# Patient Record
Sex: Female | Born: 1970 | Race: White | Hispanic: No | Marital: Married | Smoking: Former smoker
Health system: Southern US, Community
[De-identification: ages and names within clinical notes are randomized; demographics above are authoritative.]

## PROBLEM LIST (undated history)

## (undated) DIAGNOSIS — G43909 Migraine, unspecified, not intractable, without status migrainosus: Secondary | ICD-10-CM

## (undated) DIAGNOSIS — D869 Sarcoidosis, unspecified: Secondary | ICD-10-CM

## (undated) DIAGNOSIS — G510 Bell's palsy: Secondary | ICD-10-CM

## (undated) NOTE — ED Provider Notes (Signed)
 Formatting of this note is different from the original. Images from the original note were not included. eMERGENCY dEPARTMENT eNCOUnter    CHIEF COMPLAINT   Chief Complaint  Patient presents with  ? Wound Dehiscence    Surgery this AM to removed ganglion cyst and bone spur to left great toe. Dr. Jerrye at [place] with [facility]. Wound has opened up. Seen at urgent care and sent for surgeon evaluation.    HPI   Kristin Bowman is a 36 y.o. female who presents to the ED for evaluation of a surgical wound.  Per patient, she underwent surgery this morning on the left great toe for removal of a ganglionic cyst and bone spur.  Patient states that a layered closure was performed.  States that the skin was closed with glue.  States that shortly after leaving the surgical center this morning he the surgical incision opened back up.  Noted associated pain and bleeding from the site.  She went to urgent care and was told to come to the emergency department for further evaluation. No reported trauma.   Additional historical information was provided by husband, at bedside.  PAST MEDICAL HISTORY   Past Medical History:  Diagnosis Date  ? Bell's palsy   ? Celiac disease   ? Pulmonary sarcoidosis [facility]    SURGICAL HISTORY   Past Surgical History:  Procedure Laterality Date  ? HYSTERECTOMY     CURRENT MEDICATIONS   No current facility-administered medications for this encounter.   No current outpatient medications on file.   ALLERGIES   Allergies  Allergen Reactions  ? Wheat Other (See Comments)    celiac  ? Morphine Nausea And Vomiting   FAMILY HISTORY   History reviewed. No pertinent family history.  SOCIAL HISTORY   Social History   Socioeconomic History  ? Marital status: Married    Spouse name: None  ? Number of children: None  ? Years of education: None  ? Highest education level: None  Occupational History  ? None  Tobacco Use  ? Smoking status: None  ? Smokeless  tobacco: Never  Substance and Sexual Activity  ? Alcohol use: Yes    Alcohol/week: 3.0 standard drinks of alcohol    Types: 3 Glasses of wine per week  ? Drug use: Never  ? Sexual activity: None  Other Topics Concern  ? None  Social History Narrative  ? None   Social Determinants of Health   Financial Resource Strain: Not on file  Food Insecurity: Not on file  Transportation Needs: Not on file  Social Connections: Not on file  Housing Stability: Not on file   REVIEW OF SYSTEMS   Constitutional:  Denies fever Eyes:  Denies redness, discharge HENT:  Denies sore throat, ear pain  Respiratory:  Denies shortness of breath Cardiovascular:  Denies chest pain GI:  Denies abdominal pain Musculoskeletal:  Positive for left great toe pain. Skin:  Positive for surgical wound dehiscence. Neurologic:  Denies focal weakness, sensory changes See HPI for further details.  PHYSICAL EXAM   VITAL SIGNS: BP (!) 157/81   Pulse 84   Temp 97.7 F (36.5 C)   Resp 16   Ht 5' 9 (1.753 m)   Wt 185 lb (83.9 kg)   SpO2 98%   BMI 27.32 kg/m  Constitutional:  Well developed and well nourished. No acute distress. Well appearing. Eyes:  PERRL. EOMI. Conjunctiva normal. No discharge HENT: Normocephalic. Atraumatic. External ears clear bilaterally. Nares clear bilaterally. Oropharynx is  clear. Mucous membranes are moist Neck: Normal inspection. Normal range of motion. Neck is supple  Respiratory:  Clear to ausculation bilaterally. No wheezing Cardiovascular:  Normal heart rate. Normal rhythm. No murmurs, rubs, or gallops. Abdomen: Bowel sounds normal. Abdomen is soft, non-tender, and non-distended. No rebound or guarding. Extremities:  3 cm surgical incision to the dorsum of the left foot over the 1st MTP joint. Wound has opened with small amount of the clotted blood in the wound. 2+ left DP pulse. Back: Normal inspection Skin:  Warm, dry, no erythema, no rash.  Neurologic:  Awake and alert x4.  Sensation intact. Strength 5/5 bilateral upper and lower extremities Psych: Mood is normal.  RADIOLOGY   None  PROCEDURES    Laceration Repair Procedure Note  Sterile prep and drape in the standard technique after thorough high-pressure irrigation with normal saline.  Suture placement: A 3 cm opened surgical incision to the left foot.  Area was anesthetized with 1% lidocaine.  4 sutures were placed using 4.0 of Ethilon with good approximation of the wound edges.  Patient tolerated the procedures without any problems. No foreign bodies were seen.  Complications: None  ED COURSE & MEDICAL DECISION MAKING    This is a 7 yo female with reported PMHx of Bells Palsy, Celiac diseases, and pulmonary sarcoidosis who presents to the ED with left foot surgical wound dehiscence.  Patient hypertensive at 157/81.  Vital signs otherwise stable.  Exam, as above, with 3 cm surgical wound dehiscence.  No evidence for secondary infection.  Ddx:  Wound dehiscence  Medications administered:  Lidocaine and Keflex  Consultations:  Orthopedic surgery, Dr. Jerrye  ED Course:  Initially discussed case with Dr. Rosan, orthopedic surgery who recommended Steri-Strips and outpatient follow-up.  I was able to get in touch with Dr. Jerrye who did the surgery earlier today.  He actually requested that we close the wound with sutures.  I was able to close the patient's wound without difficulty.  She received dose of Keflex.  She will follow-up with Dr. Jerrye in the office tomorrow.  I believe she is safe for discharge.  Strict return precautions given.  She is agreeable.  FINAL IMPRESSION   1. Postoperative wound dehiscence, initial encounter      laceration repair  Portions of this note may be dictated using Dragon Naturally Speaking voice recognition software.  Variances in spelling and vocabulary are possible and unintentional.  Not all errors are caught/corrected.  Please notify the dino if any  discrepancies are noted or if the meaning of any statement is not clear.    Arlean CHRISTELLA Otto, MD 08/10/21 2034  Electronically signed by Arlean CHRISTELLA Otto, MD at 08/10/2021  8:34 PM EDT

---

## 1998-03-02 ENCOUNTER — Inpatient Hospital Stay (HOSPITAL_COMMUNITY): Admission: AD | Admit: 1998-03-02 | Discharge: 1998-03-02 | Payer: Self-pay | Admitting: Obstetrics and Gynecology

## 1998-04-13 ENCOUNTER — Inpatient Hospital Stay (HOSPITAL_COMMUNITY): Admission: AD | Admit: 1998-04-13 | Discharge: 1998-04-13 | Payer: Self-pay | Admitting: *Deleted

## 1998-04-14 ENCOUNTER — Inpatient Hospital Stay (HOSPITAL_COMMUNITY): Admission: AD | Admit: 1998-04-14 | Discharge: 1998-04-14 | Payer: Self-pay | Admitting: Obstetrics and Gynecology

## 1998-04-19 ENCOUNTER — Inpatient Hospital Stay (HOSPITAL_COMMUNITY): Admission: AD | Admit: 1998-04-19 | Discharge: 1998-04-22 | Payer: Self-pay | Admitting: Obstetrics and Gynecology

## 1998-04-22 ENCOUNTER — Encounter (HOSPITAL_COMMUNITY): Admission: RE | Admit: 1998-04-22 | Discharge: 1998-07-21 | Payer: Self-pay | Admitting: *Deleted

## 1998-05-25 ENCOUNTER — Other Ambulatory Visit: Admission: RE | Admit: 1998-05-25 | Discharge: 1998-05-25 | Payer: Self-pay | Admitting: Obstetrics and Gynecology

## 1998-07-24 ENCOUNTER — Encounter (HOSPITAL_COMMUNITY): Admission: RE | Admit: 1998-07-24 | Discharge: 1998-10-22 | Payer: Self-pay | Admitting: Obstetrics and Gynecology

## 1998-10-24 ENCOUNTER — Encounter (HOSPITAL_COMMUNITY): Admission: RE | Admit: 1998-10-24 | Discharge: 1999-01-22 | Payer: Self-pay | Admitting: Obstetrics and Gynecology

## 1999-03-03 ENCOUNTER — Encounter: Payer: Self-pay | Admitting: Obstetrics and Gynecology

## 1999-03-03 ENCOUNTER — Ambulatory Visit (HOSPITAL_COMMUNITY): Admission: RE | Admit: 1999-03-03 | Discharge: 1999-03-03 | Payer: Self-pay | Admitting: Obstetrics and Gynecology

## 1999-08-12 ENCOUNTER — Encounter (INDEPENDENT_AMBULATORY_CARE_PROVIDER_SITE_OTHER): Payer: Self-pay | Admitting: Specialist

## 1999-08-12 ENCOUNTER — Other Ambulatory Visit: Admission: RE | Admit: 1999-08-12 | Discharge: 1999-08-12 | Payer: Self-pay | Admitting: Obstetrics and Gynecology

## 2000-07-04 ENCOUNTER — Other Ambulatory Visit: Admission: RE | Admit: 2000-07-04 | Discharge: 2000-07-04 | Payer: Self-pay | Admitting: Obstetrics and Gynecology

## 2001-08-02 ENCOUNTER — Other Ambulatory Visit: Admission: RE | Admit: 2001-08-02 | Discharge: 2001-08-02 | Payer: Self-pay | Admitting: Obstetrics and Gynecology

## 2002-09-30 ENCOUNTER — Other Ambulatory Visit: Admission: RE | Admit: 2002-09-30 | Discharge: 2002-09-30 | Payer: Self-pay | Admitting: Obstetrics and Gynecology

## 2003-02-23 ENCOUNTER — Encounter: Admission: RE | Admit: 2003-02-23 | Discharge: 2003-02-23 | Payer: Self-pay | Admitting: Orthopedic Surgery

## 2004-01-07 ENCOUNTER — Other Ambulatory Visit: Admission: RE | Admit: 2004-01-07 | Discharge: 2004-01-07 | Payer: Self-pay | Admitting: Obstetrics and Gynecology

## 2004-04-08 ENCOUNTER — Ambulatory Visit (HOSPITAL_COMMUNITY): Admission: RE | Admit: 2004-04-08 | Discharge: 2004-04-08 | Payer: Self-pay | Admitting: Obstetrics and Gynecology

## 2004-06-30 ENCOUNTER — Encounter: Admission: RE | Admit: 2004-06-30 | Discharge: 2004-06-30 | Payer: Self-pay | Admitting: Orthopedic Surgery

## 2005-02-14 ENCOUNTER — Other Ambulatory Visit: Admission: RE | Admit: 2005-02-14 | Discharge: 2005-02-14 | Payer: Self-pay | Admitting: Obstetrics and Gynecology

## 2005-05-03 ENCOUNTER — Encounter (INDEPENDENT_AMBULATORY_CARE_PROVIDER_SITE_OTHER): Payer: Self-pay | Admitting: *Deleted

## 2005-05-03 ENCOUNTER — Ambulatory Visit (HOSPITAL_COMMUNITY): Admission: RE | Admit: 2005-05-03 | Discharge: 2005-05-04 | Payer: Self-pay | Admitting: Obstetrics and Gynecology

## 2008-02-03 ENCOUNTER — Ambulatory Visit: Payer: Self-pay | Admitting: Diagnostic Radiology

## 2008-02-03 ENCOUNTER — Emergency Department (HOSPITAL_BASED_OUTPATIENT_CLINIC_OR_DEPARTMENT_OTHER): Admission: EM | Admit: 2008-02-03 | Discharge: 2008-02-04 | Payer: Self-pay | Admitting: Emergency Medicine

## 2010-04-19 LAB — BASIC METABOLIC PANEL
BUN: 10 mg/dL (ref 6–23)
CO2: 26 mEq/L (ref 19–32)
Calcium: 9.3 mg/dL (ref 8.4–10.5)
Chloride: 103 mEq/L (ref 96–112)
Creatinine, Ser: 0.8 mg/dL (ref 0.4–1.2)
GFR calc Af Amer: >60 mL/min (ref >60)
GFR calc non Af Amer: >60 mL/min (ref >60)
Glucose, Bld: 113 mg/dL — ABNORMAL HIGH (ref 70–99)
Potassium: 3.9 mEq/L (ref 3.5–5.1)
Sodium: 136 mEq/L (ref 135–145)

## 2010-04-19 LAB — DIFFERENTIAL
Basophils Relative: 3 % — ABNORMAL HIGH (ref 0–1)
Eosinophils Absolute: 0.3 10*3/uL (ref 0.0–0.7)
Eosinophils Relative: 4 % (ref 0–5)
Lymphs Abs: 1.1 10*3/uL (ref 0.7–4.0)
Monocytes Relative: 11 % (ref 3–12)
Neutrophils Relative %: 65 % (ref 43–77)

## 2010-04-19 LAB — CBC
HCT: 40.7 % (ref 36.0–46.0)
MCHC: 35.3 g/dL (ref 30.0–36.0)
MCV: 91.9 fL (ref 78.0–100.0)
Platelets: 240 10*3/uL (ref 150–400)
RBC: 4.43 MIL/uL (ref 3.87–5.11)
WBC: 6.8 10*3/uL (ref 4.0–10.5)

## 2010-05-20 NOTE — H&P (Signed)
NAME:  Kristin Bowman, SOLECKI             ACCOUNT NO.:  [id number]   MEDICAL RECORD NO.:  [id number]          PATIENT TYPE:  AMB   LOCATION:  SDC                           FACILITY:  [facility]   PHYSICIAN:  Duke Salvia. Marcelle Overlie, M.D.DATE OF BIRTH:  08/23/1970   DATE OF ADMISSION:  04/08/2004  DATE OF DISCHARGE:                                HISTORY & PHYSICAL   CHIEF COMPLAINT:  A 40 year old, G4, P2, her husband has a vasectomy. The  patient has been bothered for 6-12 months with severe mid and left lower  quadrant pain especially with intercourse.  She has a history of celiac  disease although she has not been pursuing the diet as rigidly as she  should. We discussed the procedure of laparoscopy with possible with  possible LSO's. The procedure including risks of bleeding, infection,  adjacent organ injury with a possible need for open LSO or additional  surgery reviewed which she understands and accepts.   PAST MEDICAL HISTORY:  Allergies none.   CURRENT MEDICATIONS:  None.   CIGARETTES:  She is smoking one PPD.   PAST SURGICAL HISTORY:  She had a year ago surgery on her right shoulder.  She has also had prior diagnostic lab in 1994 and two vaginal deliveries.   FAMILY HISTORY:  Otherwise unremarkable.   PHYSICAL EXAMINATION:  VITAL SIGNS:  Temperature 98.2, blood pressure  128/68.  HEENT:  Unremarkable.  NECK:  Supple without masses.  LUNGS:  Clear.  CARDIOVASCULAR:  Regular rate and rhythm without murmurs, rubs or gallops.  BREASTS:  Without masses.  ABDOMEN:  Soft, flat, nontender.  PELVIC:  Normal external genitalia. Vagina _and cervix: clear_________.  Uterus mid position, normal size, adnexa negative, slightly tender on the  left but no nodularity or masses noted.  EXTREMITIES:  Unremarkable.  NEUROLOGIC:  Unremarkable.   IMPRESSION:  Chronic pelvic pain/dyspareunia primarily on the left.   PLAN:  Diagnostic laparoscopy with possible LSO. Procedure and risks  reviewed as  above.    RMH/MEDQ  D:  04/04/2004  T:  04/04/2004  Job:  [id number]

## 2010-05-20 NOTE — H&P (Signed)
NAME:  Kristin Bowman, Kristin Bowman             ACCOUNT NO.:  [id number]   MEDICAL RECORD NO.:  [id number]          PATIENT TYPE:  AMB   LOCATION:  SDC                           [facility]ILITY:  [facility]   PHYSICIAN:  Duke Salvia. Marcelle Overlie, M.D.DATE OF BIRTH:  1970-04-12   DATE OF ADMISSION:  05/03/2005  DATE OF DISCHARGE:                                HISTORY & PHYSICAL   CHIEF COMPLAINT:  Deep dyspareunia.   HISTORY OF PRESENT ILLNESS:  A 40 year old, G4, P2.  Her husband has had a  vasectomy.  A year ago, this patient had laparoscopy to evaluate the  collisional dyspareunia she was experiencing.  Normal findings were noted.  She has been on Weight Watchers, has quit smoking, and has dropped 20  pounds, continues to have this pain, almost precluding sex, presents now for  [facility].  This procedure, including risks of bleeding, infection, adjacent organ  injury, the possible need for open or additional surgery along with her  expected recovery time all discussed which she understands and accepts.   PAST MEDICAL HISTORY:   ALLERGIES:  None.   CURRENT MEDICATIONS:  None.   OTHER SURGERY:  1.  Laparoscopy in April 2006.  2.  She has had shoulder surgery in the past.  3.  Prior diagnostic laparoscopy also in 1994.  4.  Two vaginal deliveries.   FAMILY HISTORY:  Otherwise unremarkable.   PHYSICAL EXAMINATION:  VITAL SIGNS:  Temp 98.2, blood pressure 120/70.  HEENT:  Unremarkable.  NECK:  Supple without masses.  LUNGS:  Clear.  CARDIOVASCULAR:  Regular rate and rhythm without murmurs, rubs, or gallops.  BREASTS:  Without masses.  ABDOMEN:  Soft, nontender.  PELVIC EXAM:  Normal external genitalia.  The vagina and cervix were clear.  Uterus mid position, normal size, adnexa negative.   IMPRESSION:  Collisional dyspareunia, normal laparoscopic exam.   PLAN:  TVH.  Procedure and risks reviewed as above.      Richard M. Marcelle Overlie, M.D.  Electronically Signed     RMH/MEDQ  D:  04/25/2005  T:   04/25/2005  Job:  [id number]

## 2010-05-20 NOTE — Op Note (Signed)
NAME:  Kristin Bowman, Kristin Bowman             ACCOUNT NO.:  [id number]   MEDICAL RECORD NO.:  [id number]          PATIENT TYPE:  AMB   LOCATION:  SDC                           [facility]  [facility]   PHYSICIAN:  Duke Salvia. Marcelle Overlie, M.D.DATE OF BIRTH:  September 28, 1970   DATE OF PROCEDURE:  04/08/2004  DATE OF DISCHARGE:                                 OPERATIVE REPORT   PREOPERATIVE DIAGNOSES:  1.  Pelvic pain, predominantly on the left.  2.  Dyspareunia.   POSTOPERATIVE DIAGNOSES:  1.  Pelvic pain, predominantly on the left.  2.  Dyspareunia.   PROCEDURE:  Diagnostic laparoscopy.   SURGEON:  Duke Salvia. Marcelle Overlie, M.D.   ANESTHESIA:  General endotracheal.   COMPLICATIONS:  None.   DRAINS:  In-and-out Foley catheter.   ESTIMATED BLOOD LOSS:  Minimal.   PROCEDURE AND FINDINGS:  The patient was taken to the operating room.  After  an adequate level of general endotracheal anesthesia was obtained with the  legs in stirrups, the abdomen, perineum and vagina were prepped and draped  in the usual manner for laparoscopy.  The bladder was drained, EUA carried  out.  The uterus was small, anterior, normal size, adnexa negative.  A Hulka  tenaculum was positioned and attention directed to the abdomen.  The  subumbilical area was infiltrated with 0.5% Marcaine plain.  A small  incision was made.  The Veress needle was introduced without difficulty.  Its intra-abdominal position was verified by pressure and water testing.  After a 2.5 L pneumoperitoneum was then created, laparoscopic trocar and  sleeve were then introduced without difficulty.  There was no evidence of  any bleeding or trauma.  Three fingerbreadths above the symphysis in the  midline, a 5 mm trocar was inserted under direct visualization.   The patient placed in Trendelenburg with the pelvic findings as follows:  The anterior and posterior cul-de-sac spaces were unremarkable.  The uterus  itself was normal size, and serosa was free and clear.   Both adnexal areas  on careful inspection were normal.  Specifically, no evidence of peri-  adnexal adhesions or endometriosis.  Both inguinal areas, including the  course of the round ligament, appeared to be unremarkable.  No varicosities  were noted on either side but in particular on the left.  The course of the  cecum was unremarkable.  The ureteral course on either side was normal in  the pelvis.  The appendix appeared to be prominent, but there were no  significant adhesions or  hyperemia.  The liver edge was normal.  No other abnormal findings were  noted.  These findings were photo-documented, instruments were removed, gas  allowed to escape.  Deep fascia closed with 4-0 Dexon and subcuticular  sutures and Dermabond.  She tolerated this well and went to the recovery  room in good condition.      RMH/MEDQ  D:  04/08/2004  T:  04/08/2004  Job:  [id number]

## 2010-05-20 NOTE — Op Note (Signed)
NAME:  Kristin Bowman, Kristin Bowman             ACCOUNT NO.:  [id number]   MEDICAL RECORD NO.:  [id number]          PATIENT TYPE:  AMB   LOCATION:  SDC                           [facility]   PHYSICIAN:  Duke Salvia. Marcelle Overlie, M.D.DATE OF BIRTH:  1970-06-14   DATE OF PROCEDURE:  05/03/2005  DATE OF DISCHARGE:                                 OPERATIVE REPORT   PREOP DIAGNOSIS:  Collisional dyspareunia, pelvic pain.   POSTOP DIAGNOSIS:  Collisional dyspareunia, pelvic pain.   PROCEDURE:  Total vaginal hysterectomy.   SURGEON:  [place].   ASSISTANT:  [place].   ANESTHESIA:  General endotracheal.   SPECIMENS REMOVED:  Uterus.   ESTIMATED BLOOD LOSS:  100 mL.   PROCEDURE AND FINDINGS:  The patient was taken to the operating room and  after adequate level of general endotracheal anesthesia was obtained with  the legs in stirrups, the perineum and vagina were prepped and draped with  Betadine.  The bladder was drained.  EUA carried out.  Uterus midposition,  normal size.  Adnexa negative.  Weighted speculum was positioned. Cervix was  grasped with a tenaculum.  Cervical vaginal mucosa was incised.  Posterior  colpotomy performed without difficulty.  The bladder was advanced superiorly  with sharp and blunt dissection.  The peritoneum was identified and was  entered sharply and a retractor used to gently elevate the bladder out of  the field.  The uterosacral ligaments were then clamped, coagulated and  divided with the hand held Gyrus PK instrument.  The same was repeated close  to the uterus with the cardinal ligament and uterine vasculature pedicles.  The fundus of the uterus was then delivered posteriorly.  Remaining pedicles  were clamped, divided for free tie, followed by suture ligature of 0 Vicryl.  Ovaries were inspected and noted to be normal.  Posterior cuff was then  closed at 3 and 9 o'clock with a running locked, 2-0 Vicryl suture.  A 2-0  Vicryl McCall's culdoplasty suture was placed  from left uterosacral ligament  picking up posterior perineum across to the opposite uterosacral ligament  and tied for extra posterior support.  Prior to closure, sponge, needle and  instrument counts were reported as correct x 2.  The vaginal mucosa was  closed right to left with interrupted 2-0 Monocryl sutures.  Foley catheter  positioned draining clear urine.  She tolerated this well, went to recovery  room in good condition.      Richard M. Marcelle Overlie, M.D.  Electronically Signed    RMH/MEDQ  D:  05/03/2005  T:  05/03/2005  Job:  [id number]

## 2013-08-17 ENCOUNTER — Emergency Department (HOSPITAL_BASED_OUTPATIENT_CLINIC_OR_DEPARTMENT_OTHER)
Admission: EM | Admit: 2013-08-17 | Discharge: 2013-08-17 | Disposition: A | Discharge status: Home / Self Care | Payer: BC Managed Care – PPO | Attending: Emergency Medicine | Admitting: Emergency Medicine

## 2013-08-17 ENCOUNTER — Encounter (HOSPITAL_BASED_OUTPATIENT_CLINIC_OR_DEPARTMENT_OTHER): Payer: Self-pay | Admitting: Emergency Medicine

## 2013-08-17 DIAGNOSIS — Z8619 Personal history of other infectious and parasitic diseases: Secondary | ICD-10-CM | POA: Insufficient documentation

## 2013-08-17 DIAGNOSIS — T7840XA Allergy, unspecified, initial encounter: Secondary | ICD-10-CM

## 2013-08-17 DIAGNOSIS — L299 Pruritus, unspecified: Secondary | ICD-10-CM | POA: Diagnosis not present

## 2013-08-17 DIAGNOSIS — IMO0002 Reserved for concepts with insufficient information to code with codable children: Secondary | ICD-10-CM | POA: Insufficient documentation

## 2013-08-17 DIAGNOSIS — R21 Rash and other nonspecific skin eruption: Secondary | ICD-10-CM | POA: Insufficient documentation

## 2013-08-17 HISTORY — DX: Sarcoidosis, unspecified: D86.9

## 2013-08-17 MED ORDER — DIPHENHYDRAMINE HCL 25 MG PO TABS: 50.0000 mg | ORAL_TABLET | Freq: Four times a day (QID) | ORAL | Status: AC

## 2013-08-17 MED ORDER — DIPHENHYDRAMINE HCL 50 MG/ML IJ SOLN
25.0000 mg | Freq: Once | INTRAMUSCULAR | Status: AC
  Administered 2013-08-17: 25 mg via INTRAVENOUS
  Filled 2013-08-17: qty 1

## 2013-08-17 MED ORDER — METHYLPREDNISOLONE SODIUM SUCC 125 MG IJ SOLR
125.0000 mg | Freq: Once | INTRAMUSCULAR | Status: AC
  Administered 2013-08-17: 125 mg via INTRAVENOUS
  Filled 2013-08-17: qty 2

## 2013-08-17 MED ORDER — PREDNISONE 10 MG PO TABS: 20.0000 mg | ORAL_TABLET | Freq: Every day | ORAL | Status: DC

## 2013-08-17 NOTE — ED Notes (Signed)
Pt discharged to home with family. NAD.  

## 2013-08-17 NOTE — ED Provider Notes (Signed)
CSN: [id number]     Arrival date & time 08/17/13  4098 History   First MD Initiated Contact with Patient 08/17/13 1005     Chief Complaint  Patient presents with  . Allergic Reaction     (Consider location/radiation/quality/duration/timing/severity/associated sxs/prior Treatment) HPI Pt presents with c/o rash.  She has rash to face, chin, legs and arms.  She states she had poison ivy rash on legs several days ago, she took one dose of prednisone several days ago.  This morning she developed rash on face and arms.  No difficulty breathing, no lip or tongue swelling.  This morning she took benadryl and 8mg  solumedrol which was left over from a pior dose pack.  There are no other associated systemic symptoms, there are no other alleviating or modifying factors.   Past Medical History  Diagnosis Date  . Sarcoidosis    History reviewed. No pertinent past surgical history. No family history on file. History  Substance Use Topics  . Smoking status: Never Smoker   . Smokeless tobacco: Not on file  . Alcohol Use: Not on file   OB History   Grav Para Term Preterm Abortions TAB SAB Ect Mult Living                 Review of Systems ROS reviewed and all otherwise negative except for mentioned in HPI    Allergies  Morphine and related  Home Medications   Prior to Admission medications   Medication Sig Start Date End Date Taking? Authorizing Provider  methylPREDNISolone (MEDROL) 4 MG tablet Take 8 mg by mouth daily.   Yes Historical Provider, MD  diphenhydrAMINE (BENADRYL) 25 MG tablet Take 2 tablets (50 mg total) by mouth every 6 (six) hours. Take 1-2 tablets every 6 hours x 2 days, then space out to an as needed basis 08/17/13   Ethelda Chick, MD  predniSONE (DELTASONE) 10 MG tablet Take 2 tablets (20 mg total) by mouth daily. 6, 5, 4, 3, 2, 1- take po qD x 3 days each 08/17/13   Ethelda Chick, MD   BP 118/72  Pulse 73  Temp(Src) 98.7 F (37.1 C) (Oral)  Resp 18  SpO2  98% Vitals reviewed Physical Exam Physical Examination: General appearance - alert, well appearing, and in no distress Mental status - alert, oriented to person, place, and time Eyes - no conjunctival injection, no scleral icterus Mouth - mucous membranes moist, pharynx normal without lesions, no lip or tongue swelling Chest - clear to auscultation, no wheezes, rales or rhonchi, symmetric air entry Heart - normal rate, regular rhythm, normal S1, S2, no murmurs, rubs, clicks or gallops Abdomen - soft, nontender, nondistended, no masses or organomegaly Extremities - peripheral pulses normal, no pedal edema, no clubbing or cyanosis Skin - maculopapular erythematous rash over legs, arms, face- some have appearance of hives, on legs thee are linear central areas that appear c/w poison ivy.    ED Course  Procedures (including critical care time) Labs Review Labs Reviewed - No data to display  Imaging Review No results found.   EKG Interpretation None      MDM   Final diagnoses:  Allergic reaction, initial encounter    Pt presenting with c/o itching and rash.  Appearance on legs is most c/w poison ivy, hives over face and upper arms.  This may be a rebound reaction from patient taking one dose of prednisone.  Pt treated with benadryl, solumedrol.  givne rx for steroid course.  No airway involvement.  Discharged with strict return precautions.  Pt agreeable with plan.    Ethelda ChickMartha K Linker, MD 08/21/13 [phone]

## 2013-08-17 NOTE — ED Notes (Signed)
Patient here with itching and burning with raised rash to face, chin, arms and legs x 2 days that has worsened. Patient thinks that it may be related to eating raspberries. Took benadryl last pm and 8mg  of methylprednisone this am. No tightness in throat, no shortness of breath

## 2013-08-17 NOTE — Discharge Instructions (Signed)
Return to the ED with any concerns including difficulty breathing or swallowing, lip or tongue swelling, vomiting, fainting, decreased level of alertness/lethargy, or any other alarming symptoms

## 2014-04-08 ENCOUNTER — Ambulatory Visit (INDEPENDENT_AMBULATORY_CARE_PROVIDER_SITE_OTHER): Payer: BC Managed Care – PPO | Admitting: Podiatry

## 2014-04-08 ENCOUNTER — Ambulatory Visit (INDEPENDENT_AMBULATORY_CARE_PROVIDER_SITE_OTHER): Payer: BC Managed Care – PPO

## 2014-04-08 ENCOUNTER — Encounter: Payer: Self-pay | Admitting: Podiatry

## 2014-04-08 VITALS — BP 126/78 | HR 71 | Resp 18

## 2014-04-08 DIAGNOSIS — M8430XA Stress fracture, unspecified site, initial encounter for fracture: Secondary | ICD-10-CM

## 2014-04-08 DIAGNOSIS — G8929 Other chronic pain: Secondary | ICD-10-CM

## 2014-04-08 DIAGNOSIS — M79671 Pain in right foot: Secondary | ICD-10-CM

## 2014-04-08 DIAGNOSIS — R52 Pain, unspecified: Secondary | ICD-10-CM

## 2014-04-08 DIAGNOSIS — M629 Disorder of muscle, unspecified: Secondary | ICD-10-CM | POA: Diagnosis not present

## 2014-04-08 NOTE — Progress Notes (Signed)
   Subjective:    Patient ID: Kristin Bowman, female    DOB: 11-22-1970, 44 y.o.   MRN: [id number]  HPI  [age] year old female presents the office today with complaints of right foot pain. She states that she previously has been seen Dr. Elijah Birkom for which she was treated for heel pain. She states that she had multiple cortisone injections of the heel as well as icing and stretching in shoe gear changes for plantar fasciitis. She then states that she had a tear of the plantar fascia and was in the boot for approximate 6 months. After which she started to develop numbness into into her right fourth and fifth toes. She was then seen by neurology and found have Bell's palsy and other issues and possibly L5 issues. She also states that she was not multiple stress fractures and had vitamin D deficiency. She continues to have pain to the right heel at this time. Denies any recent injury or trauma. No other complaints at this time  Review of Systems  Respiratory:       Sarcoidosis  Gastrointestinal:       Celiac  Neurological: Positive for numbness.          All other systems reviewed and are negative.      Objective:   Physical Exam AAO 3, NAD  DP/PT pulses palpable, CRT less than 3 seconds  Protective sensation appears to be intact with Dorann OuSimms Weinstein Monofilament, vibratory sensation intact, Achilles tendon reflex intact.  There is mild to palpation upon the plantar medial tubercle of the calcaneus. Insertion the plantar fascia. Plantar fascia does appear to be not as tight as compared to the contralateral extremity consistent with at least a partial tear. There is no pain with lateral compression of the calcaneus or pain with vibratory sensation. No pain on the posterior aspect of the calcaneus or along the course/insertional Achilles tendon. There is mild diffuse tenderness along the dorsal aspect of the forefoot with of right foot. No specific area pinpoint bony tenderness and there is no pain  with vibratory sensation. There is no overlying edema, erythema, increase in warmth to bilateral lower extremities. MMT 5/5, ROM WNL No open lesions or pre-ulcer lesions identified bilaterally.  No pain with calf compression, swelling, warmth, erythema.      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 There is currently no sign of stress fracture fracture. -Treatment options were discussed the patient include alternatives, risks, complications. Discussed likely etiology of her symptoms. -Given the diffuse symptoms as well as continued pain in likely plantar fasciitis tear we'll obtain an MRI. Discussed with her that I do not believe that simply heel spur center decrease her pain at this time however we will reevaluate after the MRI. Also given her medical conditions and chronic steroid use she is at risk for complications from surgery. -Given the history of multiple stress fracture should likely benefit from a rocker-bottom shoe. She wishes to hold off on this at this time. -Follow-up after MRI or sooner if any problems are to arise. In the meantime encouraged to call the office if any questions, concerns, change in symptoms.

## 2014-04-22 ENCOUNTER — Telehealth: Payer: Self-pay | Admitting: *Deleted

## 2014-04-22 NOTE — Telephone Encounter (Signed)
"  I[facility] not [facility] with [facility].  I want to get he CPT code and call my insurance myself.  They'll talking about charging me [phone]."  The code is 73720.  "Okay, is that all?"  Yes, that's it.  "It costs that much for one procedure?"  That's for with and without contrast.  "I understand that but I had a MRI of my brain with and without contrast and it didn't cost that much!  So, I'm going to call my insurance.  I'M going to call [facility] and check their cost.  I may call you to reschedule it later."  That is fine.

## 2014-04-29 ENCOUNTER — Telehealth: Payer: Self-pay | Admitting: *Deleted

## 2014-04-29 NOTE — Telephone Encounter (Signed)
"  Patient's MRI of right foot needs authorization from [employer].  Her appointment is for tomorrow."    I got authorization of the MRI.  It was authorized from 04/29/2014 - 05/28/2014.  Authorization number is [id number].  [facility].

## 2014-04-30 ENCOUNTER — Ambulatory Visit
Admission: RE | Admit: 2014-04-30 | Discharge: 2014-04-30 | Disposition: A | Discharge status: Home / Self Care | Payer: BC Managed Care – PPO | Source: Ambulatory Visit | Attending: Podiatry | Admitting: Podiatry

## 2014-04-30 DIAGNOSIS — M79671 Pain in right foot: Secondary | ICD-10-CM

## 2014-04-30 DIAGNOSIS — R52 Pain, unspecified: Secondary | ICD-10-CM

## 2014-04-30 DIAGNOSIS — M629 Disorder of muscle, unspecified: Secondary | ICD-10-CM

## 2014-04-30 DIAGNOSIS — G8929 Other chronic pain: Secondary | ICD-10-CM

## 2014-04-30 DIAGNOSIS — M8430XA Stress fracture, unspecified site, initial encounter for fracture: Secondary | ICD-10-CM

## 2014-04-30 MED ORDER — GADOBENATE DIMEGLUMINE 529 MG/ML IV SOLN
15.0000 mL | Freq: Once | INTRAVENOUS | Status: AC | PRN
  Administered 2014-04-30: 15 mL via INTRAVENOUS

## 2014-05-01 ENCOUNTER — Telehealth: Payer: Self-pay | Admitting: *Deleted

## 2014-05-01 NOTE — Telephone Encounter (Signed)
Informed pt the results of her MRI per Dr. Ardelle AntonWagoner and his recommendations to wear the air fracture walker and be seen in 2 weeks.  Pt states understanding and has an appt 05/11/2014.

## 2014-05-01 NOTE — Telephone Encounter (Signed)
-----   Message from Vivi BarrackMatthew R Wagoner, DPM sent at 05/01/2014 11:49 AM EDT ----- Can someone please call this patient and let her know her MRI results? She has a partial tear of the plantar fascia as well as a muscle that attaches to the heel under the plantar fascia. She should wear a CAM boot if she has one. If she does not then she needs to come to the office. Also, follow-up in 2 weeks. Thanks.

## 2014-05-11 ENCOUNTER — Ambulatory Visit (INDEPENDENT_AMBULATORY_CARE_PROVIDER_SITE_OTHER): Payer: BC Managed Care – PPO | Admitting: Podiatry

## 2014-05-11 ENCOUNTER — Encounter: Payer: Self-pay | Admitting: Podiatry

## 2014-05-11 VITALS — BP 117/77 | HR 75 | Resp 18

## 2014-05-11 DIAGNOSIS — T148 Other injury of unspecified body region: Secondary | ICD-10-CM

## 2014-05-11 DIAGNOSIS — T148XXA Other injury of unspecified body region, initial encounter: Secondary | ICD-10-CM

## 2014-05-11 NOTE — Progress Notes (Signed)
Patient ID: Kristin Bowman, female   DOB: 02-02-1970, 44 y.o.   MRN: [id number]  Subjective: 44 year old female presents the operative discussed MRI results. She states that she continues have pain on the outside aspect of her foot. She does continue intermittent pain to the plantar aspect of the heel however the majority of her pain is in the foot on the outside aspect. She is continuing the CAM walker the majority the time however she does go into a shoe at times due to the boot been uncomfortable. Denies any recent injury or trauma. No other complaints at this time.  Objective: AAO x3, NAD DP/PT pulses palpable bilaterally, CRT less than 3 seconds Protective sensation intact with Simms Weinstein monofilament, vibratory sensation intact, Achilles tendon reflex intact There is tenderness on the lateral aspect of the foot approximately level of the fifth metatarsal base mostly on the plantar aspect. There is mild discomfort along the plantar aspect of the heel on the plantar aspect of the calcaneus. There is no pain with lateral compression of the calcaneus or pain with vibratory sensation. There is no overlying edema, erythema, increase in warmth to bilateral lower extremities. No areas of tenderness to bilateral lower extremities. MMT 5/5, ROM WNL.  No open lesions or pre-ulcerative lesions.  No pain with calf compression, swelling, warmth, erythema bilaterally.   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 She does have a history of a previous tear to the plantar fascia side bleeder this is old however there is a new tear likely of the abductor digiti quinti minimi. -I recommended continued immobilization in a Cam Walker however she stated was very uncomfortable we cannot wear. I dispensed a surgical shoe. -Continue ice and  elevation -Follow-up in 4 weeks or sooner if any problems are to arise. In the meantime, call the office with any questions, concerns, change in symptoms.

## 2014-05-19 ENCOUNTER — Telehealth: Payer: Self-pay | Admitting: Podiatry

## 2014-05-19 NOTE — Telephone Encounter (Signed)
Please let her know the boot is the best. She can try to come out of the boot and back into a regular, supportive sneaker if she is not having pain. Continue with ice. She should also wear orthotics if she has them.

## 2014-05-19 NOTE — Telephone Encounter (Signed)
PT CALLED AND SAID YOU PUT HER IN A BOOT AND IT IS NOT HELPING IT IS ACTUALLY CAUSING A NEW PAIN. SHE IS NOT SURE SHE CAN GO 3 MORE WEEKS WITH THE BOOT. PLEASE CALL PT AND ADVISE WHAT SHE CAN DO

## 2014-05-20 ENCOUNTER — Other Ambulatory Visit: Payer: Self-pay | Admitting: Obstetrics and Gynecology

## 2014-05-20 ENCOUNTER — Telehealth: Payer: Self-pay | Admitting: *Deleted

## 2014-05-20 NOTE — Telephone Encounter (Signed)
Left message with Dr. Gabriel RungWagoner's instructions and encouraged pt to keep her follow up appts.

## 2014-05-20 NOTE — Telephone Encounter (Signed)
Pt called again for the instructions, and later said they just came through.  Pt states the boot rubs her foot and the lower sandal shoe has no support.  I encouraged pt to wear the orthotic with the sandal shoe, but pt states the orthotics are from Dr. Elijah Birkom and terrible.  I transferred pt to schedulers and encouraged her to wear the supportive athletic shoe and ice periodically through the day.  Pt states she uses heat at night, I told her that it would be best if she would ice every other time.

## 2014-05-21 LAB — CYTOLOGY - PAP

## 2014-05-27 ENCOUNTER — Ambulatory Visit (INDEPENDENT_AMBULATORY_CARE_PROVIDER_SITE_OTHER): Payer: BC Managed Care – PPO | Admitting: Podiatry

## 2014-05-27 ENCOUNTER — Encounter: Payer: Self-pay | Admitting: Podiatry

## 2014-05-27 VITALS — BP 105/69 | HR 78 | Resp 18

## 2014-05-27 DIAGNOSIS — M629 Disorder of muscle, unspecified: Secondary | ICD-10-CM | POA: Diagnosis not present

## 2014-05-27 DIAGNOSIS — T148 Other injury of unspecified body region: Secondary | ICD-10-CM | POA: Diagnosis not present

## 2014-05-27 DIAGNOSIS — M79671 Pain in right foot: Secondary | ICD-10-CM | POA: Diagnosis not present

## 2014-05-27 DIAGNOSIS — T148XXA Other injury of unspecified body region, initial encounter: Secondary | ICD-10-CM

## 2014-05-27 NOTE — Patient Instructions (Signed)
Pre-Operative Instructions  Congratulations, you have decided to take an important step to improving your quality of life.  You can be assured that the doctors of [facility] will be with you every step of the way.  1. Plan to be at the surgery center/hospital at least 1 (one) hour prior to your scheduled time unless otherwise directed by the surgical center/hospital staff.  You must have a responsible adult accompany you, remain during the surgery and drive you home.  Make sure you have directions to the surgical center/hospital and know how to get there on time. 2. For hospital based surgery you will need to obtain a history and physical form from your family physician within 1 month prior to the date of surgery- we will give you a form for you primary physician.  3. We make every effort to accommodate the date you request for surgery.  There are however, times where surgery dates or times have to be moved.  We will contact you as soon as possible if a change in schedule is required.   4. No Aspirin/Ibuprofen for one week before surgery.  If you are on aspirin, any non-steroidal anti-inflammatory medications (Mobic, Aleve, Ibuprofen) you should stop taking it 7 days prior to your surgery.  You make take Tylenol  For pain prior to surgery.  5. Medications- If you are taking daily heart and blood pressure medications, seizure, reflux, allergy, asthma, anxiety, pain or diabetes medications, make sure the surgery center/hospital is aware before the day of surgery so they may notify you which medications to take or avoid the day of surgery. 6. No food or drink after midnight the night before surgery unless directed otherwise by surgical center/hospital staff. 7. No alcoholic beverages 24 hours prior to surgery.  No smoking 24 hours prior to or 24 hours after surgery. 8. Wear loose pants or shorts- loose enough to fit over bandages, boots, and casts. 9. No slip on shoes, sneakers are best. 10. Bring  your boot with you to the surgery center/hospital.  Also bring crutches or a walker if your physician has prescribed it for you.  If you do not have this equipment, it will be provided for you after surgery. 11. If you have not been contracted by the surgery center/hospital by the day before your surgery, call to confirm the date and time of your surgery. 12. Leave-time from work may vary depending on the type of surgery you have.  Appropriate arrangements should be made prior to surgery with your employer. 13. Prescriptions will be provided immediately following surgery by your doctor.  Have these filled as soon as possible after surgery and take the medication as directed. 14. Remove nail polish on the operative foot. 15. Wash the night before surgery.  The night before surgery wash the foot and leg well with the antibacterial soap provided and water paying special attention to beneath the toenails and in between the toes.  Rinse thoroughly with water and dry well with a towel.  Perform this wash unless told not to do so by your physician.  Enclosed: 1 Ice pack (please put in freezer the night before surgery)   1 Hibiclens skin cleaner   Pre-op Instructions  If you have any questions regarding the instructions, do not hesitate to call our office.  [place]: [address] [place], [place] [address] [phone]  [place]: [address] [place], [place] [address] [phone]  [place]: [address]  , [place] [address] [phone]  Dr. Richard   Tuchman DPM, Dr. Norman Regal DPM Dr. Richard Sikora DPM, Dr. M. Todd Hyatt DPM, Dr. Kathryn Egerton DPM, Dr. Lachanda Buczek DPM 

## 2014-05-31 NOTE — Progress Notes (Signed)
Patient ID: Kristin Bowman, female   DOB: 04-01-1970, 44 y.o.   MRN: [id number]  Subjective: 44 year old female presents the office today for follow-up evaluation of right heel pain. She states his his last appointment she does feel that the pain is getting worse to her heel and the arch of her foot. She continues have numbness of the fourth and fifth toes as well which has been ongoing for approximately one year. She states that she continues to have the majority of her pain to the to the heel and there is a pulling sensation to the area. She was unable to wear the CAM walker or surgical shoe and she presents today wearing a regular shoe. She denies any swelling or redness. No other complaints at this time.  Objective: AAO x3, NAD DP/PT pulses palpable bilaterally, CRT less than 3 seconds Protective sensation intact with Simms Weinstein monofilament, vibratory sensation intact, Achilles tendon reflex intact. There is subjective numbness to the right 4th and 5th toes.  There is tenderness to palpation overlying the plantar medial tubercle of the calcaneus to right heel at the insertion of the plantar fascia. There is mild pain along the course of plantar fascial within the arch of the foot. There is no pain with lateral compression of the calcaneus or pain the vibratory sensation. No pain on the posterior aspect of the calcaneus or along the course/insertion of the Achilles tendon. There is mild discomfort along the 5th metatarsal base however there is no pinpoint bony tenderness.  There is no overlying edema, erythema, increase in warmth. No other areas of tenderness palpation or pain with vibratory sensation to the foot/ankle. MMT 5/5, ROM WNL No open lesions or pre-ulcerative lesions are identified. No pain with calf compression, swelling, warmth, erythema.  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  Plan: -Treatment options were discussed both conservative and surgical including alternatives, risks, complications. -At this time the majority the symptoms do appear to be localized to the plantar medial tubercle of the calcaneus at the insertion of the plantar fascia. MRI report does show that there is a partial tear of the medial band of the plantar fascia. Because of a partial tear does appear to be some fibers still intact and she has tenderness this area. I discussed the surgical intervention which would involve exploration and plantar fasciectomy of the medial band of the fascia with application of PRP, heel spur resection. I discussed with the patient that this will likely not resolve all of her symptoms to her foot and will likely not help and numbness with the pain of the fifth metatarsal base however will hopefully help the heel. She understands this and wishes to proceed with surgery. -The incision placement as well as the postoperative course was discussed with the patient. I discussed risks of the surgery which include, but not limited to, infection, bleeding, pain, swelling, need for further surgery, delayed or nonhealing, painful or ugly scar, numbness or sensation changes, over/under correction, recurrence, transfer lesions, further deformity, hardware failure, DVT/PE, loss of toe/foot. Patient understands these risks and wishes to proceed with surgery. The surgical consent was reviewed with the patient all 3 pages were signed. No promises or guarantees were given to the outcome of the procedure. All questions were answered to the best of my ability. Before the surgery the patient was encouraged to call the office if there is any further questions. The  surgery will be performed at the [facility] on an outpatient basis.

## 2014-06-05 ENCOUNTER — Telehealth: Payer: Self-pay | Admitting: *Deleted

## 2014-06-05 NOTE — Telephone Encounter (Signed)
I called to see if we could move her surgery date from 06/17/2014 to another date.  You were scheduled incorrectly.  "When would you like to reschedule to?"  He can do it on 07/01/2014.  "That is too late.  I've had this scheduled for several weeks now.  Why has it taken you so long to call and let me know this?  I've made several arrangements for this.  I have a wedding that I have to go to and had arranged this so I would be healed by then.  I have vacation scheduled.  This is ridiculous."  Let me speak with Dr. Ardelle AntonWagoner and see what he wants to do.  He said he will do the surgery on 06/17/2014.  He said he'll just reschedule his patients for that afternoon.  "I normally wouldn't be a problem.  I just have everything planned already."

## 2014-06-15 ENCOUNTER — Ambulatory Visit: Payer: BC Managed Care – PPO | Admitting: Podiatry

## 2014-06-17 ENCOUNTER — Encounter: Payer: Self-pay | Admitting: Podiatry

## 2014-06-17 DIAGNOSIS — M722 Plantar fascial fibromatosis: Secondary | ICD-10-CM | POA: Diagnosis not present

## 2014-06-17 DIAGNOSIS — M7731 Calcaneal spur, right foot: Secondary | ICD-10-CM | POA: Diagnosis not present

## 2014-06-18 ENCOUNTER — Telehealth: Payer: Self-pay | Admitting: *Deleted

## 2014-06-18 NOTE — Telephone Encounter (Signed)
Pt states she had surgery 06/17/2014 and she wanted to know when to take her dressing off.  I told pt the dressing was to stay on until the 1st post-op check.  Pt states she has taken the wrap off that was on the gauze.  I told her to leave the gauze dressing as is and to wear the boot at all times, and sleep in it.

## 2014-06-18 NOTE — Telephone Encounter (Signed)
Pt request Knee scooter for post-op ambulation. I sent required [facility] form, pt data and insurance and last pt contact dictation to [phone].

## 2014-06-19 NOTE — Progress Notes (Signed)
DOS 06/17/2014 Right foot plantar fascia release, heel spur resection, application of platelet and plasma.

## 2014-06-22 ENCOUNTER — Telehealth: Payer: Self-pay | Admitting: *Deleted

## 2014-06-22 NOTE — Telephone Encounter (Signed)
I called patient to see how she is doing.  "I'm doing okay, staying off this foot.  I'm tired of laying around I tell you."  How's the pain medication doing for you?  "It's okay I'm trying to wean myself off it.  I've only taken one this morning that is it.  I spoke to the on-call doctor this weekend because I was experiencing some tingling in my leg from the nerve block.  He told me to loosen the ace bandage, that helped tremendously."  I'm glad to hear you are doing well.  We have you scheduled for a follow-up appointment on Wednesday.  "Yes, I'll make sure that I shower before then.  I will not get it wet I promise.  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

## 2014-06-24 ENCOUNTER — Ambulatory Visit: Payer: Self-pay

## 2014-06-24 ENCOUNTER — Encounter: Payer: Self-pay | Admitting: Podiatry

## 2014-06-24 ENCOUNTER — Ambulatory Visit (INDEPENDENT_AMBULATORY_CARE_PROVIDER_SITE_OTHER): Payer: BC Managed Care – PPO | Admitting: Podiatry

## 2014-06-24 VITALS — BP 109/70 | HR 91 | Temp 97.8°F | Resp 15

## 2014-06-24 DIAGNOSIS — Z9889 Other specified postprocedural states: Secondary | ICD-10-CM

## 2014-06-25 NOTE — Progress Notes (Signed)
Patient ID: Kristin Bowman, female   DOB: Jul 13, 1970, 44 y.o.   MRN: [id number]  DOS: 06/17/14 s/p R plantar fasciotomy, heel spur resection, PRP injection  Subjective: Ms. Kristin Bowman returns to the office 1 week s/p right foot surgery. She called the on call doctor on Friday stating that her toes were swollen and her numb. She states that that has mostly resolved. She believes on Friday the leg block was wearing off and she was getting strange sensations to her feet. She relates these are improving. She also states she did hit her surgical foot pretty hard over the incision and she has had some increase in pain over the last 24 hours, but it is improving. She has been taking the antibiotics as directed. She is not requirng much pain medication. Denies any systemic complaints such as fevers, chills, nausea, vomiting. Denies any calf pain, chest pain, SOB.   Objective: AAO x3, NAD; presents wearing a cam walker using knee scooter. DP/PT pulses palpable, CRT less than 3 seconds Protective sensation grossly intact with Dorann Ou monofilament although there is mild subjective numbness to portions of the foot. Incision on the medial aspect of the foot is well coapted without any evidence of dehiscence and sutures are intact. There is no swelling erythema, ascending cellulitis, drainage, malodor. There is no clinical signs of infection. There is mild edema overlying the medial aspect of the heel along the surgical site. There is slight tenderness palpation overlying the area of the surgery. No other areas of tenderness to bilateral lower extremity. No other areas of edema, erythema, increase in warmth. No open lesions or pre-ulcerative lesions are identified. No pain with calf compression, swelling, warmth, erythema.  Assessment: 44 year old female 1 week status post right foot surgery  Plan: -X-rays were obtained and reviewed with the patient.  -Treatment options discussed including all  alternatives, risks, and complications -Antibiotic ointment was placed over the incision followed by dry sterile dressing. Keep his dressing clean, dry, intact. -Continue nonweightbearing for now. Discussed that if her pain has decreased before next week's appointment she can start to weight-bear as tolerated to the foot in a Cam Walker at all times. However there is continued pain to remain nonweightbearing. -Ice and elevation -Pain medications needed -Follow-up in one week for likely suture removal or sooner if any problems are to arise. At that time we'll likely transition to weightbearing as tolerated in a cam boot. Continued monitoring clinical signs or symptoms of infection or DVT/PE admitted to call the office immediately should any occur or go to the ER.

## 2014-06-29 ENCOUNTER — Telehealth: Payer: Self-pay | Admitting: *Deleted

## 2014-06-29 NOTE — Telephone Encounter (Addendum)
Pt states her right 1st toe is numb, even after removing ace and self- adherent dressing, with or without weight bearing.  I discuss capillary refill, which pt states toe returns to previous color quickly after pressing and is the same temperature as the other foot.  I told her I would inform Dr. Ardelle Anton and call with information.  I informed pt of Dr. Gabriel Rung statement and recommended monitoring.

## 2014-06-29 NOTE — Telephone Encounter (Signed)
Likely a result of swelling. Continue to monitor. It may take some time for the numbness to resolve.

## 2014-07-03 ENCOUNTER — Encounter: Payer: Self-pay | Admitting: Podiatry

## 2014-07-03 ENCOUNTER — Ambulatory Visit (INDEPENDENT_AMBULATORY_CARE_PROVIDER_SITE_OTHER): Payer: BC Managed Care – PPO | Admitting: Podiatry

## 2014-07-03 VITALS — BP 115/70 | HR 74 | Resp 12

## 2014-07-03 DIAGNOSIS — Z9889 Other specified postprocedural states: Secondary | ICD-10-CM

## 2014-07-12 NOTE — Progress Notes (Signed)
Patient ID: Kristin Bowman, female   DOB: 09-Sep-1970, 44 y.o.   MRN: [id number]  DOS: 06/17/14 s/p R plantar fasciotomy, heel spur resection, PRP injection  Subjective: Ms. Kristin Bowman returns to the office 2 week s/p right foot surgery. She states that she continues to with the Cam Walker at all times and she's limited her weightbearing. She hasn't changed in the dressing every other day and applying a Band-Aid to the area. She is a small amount of bloody drainage from the area however there is no purulence. She denies any redness or any red streaks of the area. She does that she continues to have some tenderness over surgical site although it is decreased. She does that she has some numbness to her big toe which stop the same as what it was Simms after surgery. She does state however the numbness to her fourth and fifth toes has resolved or improving since having the surgery. She denies any systemic complaints as fevers, chills, nausea, vomiting. Denies any calf pain, chest pain, shortness of breath. No other complaints at this time in no acute changes since last appointment.  For PCP increase her Celebrex. She is not taking narcotic pain medicine.  Objective: AAO x3, NAD; presents wearing a cam walker DP/PT pulses palpable, CRT less than 3 seconds Protective sensation grossly intact with Simms Weinstein monofilament  Incision on the medial aspect of the foot is well coapted without any evidence of dehiscence and sutures are intact. There is no surrounding erythema, ascending cellulitis, drainage, malodor. There is no clinical signs of infection. There is mild edema overlying the medial aspect of the heel along the surgical site although it does appear to be somewhat decreased. There is slight tenderness palpation overlying the area of the surgery. No other areas of tenderness to bilateral lower extremity. No other areas of edema, erythema, increase in warmth. No open lesions or pre-ulcerative lesions  are identified. No pain with calf compression, swelling, warmth, erythema.  Assessment: 44 year old female 2 weeks status post right foot surgery  Plan: -X-rays were obtained and reviewed with the patient.  -Treatment options discussed including all alternatives, risks, and complications -Sutures removed without complications. Antibiotic ointment was placed over the incision followed by dry sterile dressing. Keep his dressing clean, dry, intact for 24-48 hours.Then can remove and start to shower as long as the incision remains coapted. There is a problems with the incision to call the office. -Can be weightbearing in the cam walker. She can also start a transition back into a regular shoe as tolerated once her pain is decreased. However she is Cam Walker or night splint at night every day. She has pain with a regular shoe to continue with a walker. -Ice and elevation -Pain medications needed -She will go to the beach for the next week. I'm discussed that she is to wear supportive shoe and/or boot at all times while and stand and she should not get in the water to the incision. -Follow-up in 3 weeks  or sooner if any problems are to arise.  Continued monitoring clinical signs or symptoms of infection or DVT/PE admitted to call the office immediately should any occur or go to the ER.  Ovid CurdMatthew Dacari Beckstrand, DPM

## 2014-07-13 ENCOUNTER — Telehealth: Payer: Self-pay | Admitting: *Deleted

## 2014-07-13 ENCOUNTER — Ambulatory Visit (INDEPENDENT_AMBULATORY_CARE_PROVIDER_SITE_OTHER): Payer: BC Managed Care – PPO | Admitting: Podiatry

## 2014-07-13 DIAGNOSIS — Z9889 Other specified postprocedural states: Secondary | ICD-10-CM

## 2014-07-13 MED ORDER — OXYCODONE-ACETAMINOPHEN 5-325 MG PO TABS: 1.0000 | ORAL_TABLET | ORAL | Status: AC | PRN

## 2014-07-13 NOTE — Telephone Encounter (Addendum)
Pt states her foot where the muscle is torn is killing, has been wearing the surgical boot, athletic shoes, and Vionic sandal, had a wedding this weekend and may have over done it.  Pt states the pain is waking her at night the past 2-3 nights.  I offered pt an appt and she said she could not seen what could be done, but would like the pain medication so she could rest.  I told her I would get Percocet refilled for her and see if Dr. Ardelle AntonWagoner would like her to come in.  Pt agreed.  I called pt for update of after pain medication was given.  Pt states she saw Dr. Ardelle AntonWagoner, he gave her a new boot and the pain medication and she slept so much better, and feels better.

## 2014-07-13 NOTE — Progress Notes (Signed)
Patient ID: Kristin Bowman, [name]   DOB: 10-01-1970, 44 y.o.   MRN: [id number]  DOS: 06/17/14 s/p R plantar fasciotomy, heel spur resection, PRP injection  Subjective: 44 year-old female presents the office today as a walk-in appointment for concerns of right heel pain. She states that last week she went to the beach and she was also at a wedding she feels that she maybe overdone it. She states on Friday after being on her feet all day she started have pain Friday night. She states that she feels as a throbbing sensation grossly to the inside aspect of the foot/ankle. She has states that she can use have pain in the office of the foot which is present prior to surgery. Also states that she had a small amount of clear bloody drainage coming from around the proximal part of the incision however she is not having any drainage or less couple of days. She denies any redness around the incision. The swelling is been about the same as what it was last appointment.  She denies any systemic complaints as fevers, chills, nausea, vomiting. Denies any calf pain, chest pain, shortness of breath. No other complaints at this time in no acute changes since last appointment.  Objective: AAO x3, NAD; presents wearing a cam walker DP/PT pulses palpable, CRT less than 3 seconds Protective sensation grossly intact with Simms Weinstein monofilament  Incision on the medial aspect of the foot is well coapted without any evidence of dehiscence and sutures are intact. There is no surrounding erythema, ascending cellulitis, drainage, malodor. There is no clinical signs of infection. There is mild edema overlying the medial aspect of the heel along the surgical site although it does appear to be somewhat decreased. There is slight tenderness palpation overlying the area of the surgery. There is also discomfort on the medial aspect of the ankle along the course of the flexor tendons. There is no overlying edema, erythema to this  area. It is also tenderness. To palpation over the lateral aspect of the foot along the 5th metatarsal base area which was present prior to surgery No other areas of tenderness to bilateral lower extremity. No other areas of edema, erythema, increase in warmth. No open lesions or pre-ulcerative lesions are identified. No pain with calf compression, swelling, warmth, erythema.  Assessment: 44 year old female 3 weeks status post right foot surgery  Plan: -Treatment options discuss  including all alternatives, risks, and complications -Recommended he continue with Cam Walker. The Cam Walker designed to be fitting well and she is applying padding around her ankle to help stabilize it more. New Cam Walker was dispensed which was fitting were appropriately. Continue cam walker at all time even while sleeping. -Ice and elevation -Refill Percocet. -Follow-up as scheuled or sooner if any problems are to arise.  Continued monitoring clinical signs or symptoms of infection or DVT/PE admitted to call the office immediately should any occur or go to the ER. Is improving we'll likely transition ankle brace next appointment.  Ovid CurdMatthew Wagoner, DPM

## 2014-07-13 NOTE — Telephone Encounter (Signed)
You can refill the percocet. Ice/elevation, continue with the boot. She should come in

## 2014-07-24 ENCOUNTER — Encounter: Payer: Self-pay | Admitting: Podiatry

## 2014-07-24 ENCOUNTER — Ambulatory Visit (INDEPENDENT_AMBULATORY_CARE_PROVIDER_SITE_OTHER): Payer: BC Managed Care – PPO | Admitting: Podiatry

## 2014-07-24 ENCOUNTER — Ambulatory Visit: Payer: BC Managed Care – PPO

## 2014-07-24 VITALS — BP 115/75 | HR 96 | Resp 18

## 2014-07-24 DIAGNOSIS — Z9889 Other specified postprocedural states: Secondary | ICD-10-CM

## 2014-07-24 DIAGNOSIS — T148 Other injury of unspecified body region: Secondary | ICD-10-CM

## 2014-07-24 DIAGNOSIS — R52 Pain, unspecified: Secondary | ICD-10-CM

## 2014-07-24 DIAGNOSIS — T148XXA Other injury of unspecified body region, initial encounter: Secondary | ICD-10-CM

## 2014-07-24 NOTE — Progress Notes (Signed)
Patient ID: Kristin Bowman, female   DOB: [birth year], 44 y.o.   MRN: [id number]  DOS: 06/17/14 s/p R plantar fasciotomy, heel spur resection, PRP injection  Subjective: 44 year old female presents the office today for follow-up evaluation status post right foot surgery. Overall she states that she is doing better to the heel after the surgery although she does continue to paint the offset aspect of her foot mostly. She has most of her pain with weightbearing and prolonged ambulation. She has transitioned back into a regular shoe since last appointment. She was wearing the night splint at night however over the last couple days she has not been wearing it as she has poison ivy. She has not been icing the area. She denies any systemic complaints as fevers, chills, nausea, vomiting. Denies any calf pain, chest pain concerns of breath. No other complaints at this time in no acute changes since last appointment.  Objective: AAO x3, NAD; presents wearing a regular shoe DP/PT pulses palpable, CRT less than 3 seconds Protective sensation grossly intact with Simms Weinstein monofilament  Incision on the medial aspect of the foot is well coapted without any evidence of dehiscence and a scab has formed. There is no surrounding erythema, ascending cellulitis, drainage, malodor. There is no clinical signs of infection. There is mild edema overlying the medial aspect of the heel along the surgical site although it does appear to be mostly decreased. There is slight tenderness palpation overlying the area of the surgery on the plantar medial tubercle although this pain has improved. There is continued pain to the lateral aspect of the foot along the fifth metatarsal base area. Is also mild discomfort along the course of the peroneal tendons inferior and posterior to the lateral malleolus. There is no pain with eversion. Peroneal tendons appear to be intact. No other areas of tenderness to bilateral lower extremity. No  other areas of edema, erythema, increase in warmth. No open lesions or pre-ulcerative lesions are identified. No pain with calf compression, swelling, warmth, erythema.  Assessment: 44 year old female status post right foot surgery  Plan: -Treatment options discuss  including all alternatives, risks, and complications -Can continue regular shoe gear. However wear night splint or cam boot at night every night. Continue ice to the area. Stretching exercises. Dispensed compression anklet. -Recommended physical therapy. A prescription for this was given to her today. -Ice and elevation -Follow-up in 2-3 weeks or sooner if any problems are to arise.  Continued monitoring clinical signs or symptoms of infection or DVT/PE admitted to call the office immediately should any occur or go to the ER.   Ovid Curd, DPM

## 2014-08-10 ENCOUNTER — Encounter: Payer: Self-pay | Admitting: Podiatry

## 2014-08-10 ENCOUNTER — Ambulatory Visit (INDEPENDENT_AMBULATORY_CARE_PROVIDER_SITE_OTHER): Payer: BC Managed Care – PPO

## 2014-08-10 ENCOUNTER — Ambulatory Visit (INDEPENDENT_AMBULATORY_CARE_PROVIDER_SITE_OTHER): Payer: BC Managed Care – PPO | Admitting: Podiatry

## 2014-08-10 VITALS — BP 106/72 | HR 79 | Resp 18

## 2014-08-10 DIAGNOSIS — R52 Pain, unspecified: Secondary | ICD-10-CM | POA: Diagnosis not present

## 2014-08-10 DIAGNOSIS — M629 Disorder of muscle, unspecified: Secondary | ICD-10-CM

## 2014-08-10 DIAGNOSIS — T148 Other injury of unspecified body region: Secondary | ICD-10-CM

## 2014-08-10 DIAGNOSIS — Z9889 Other specified postprocedural states: Secondary | ICD-10-CM

## 2014-08-10 DIAGNOSIS — T148XXA Other injury of unspecified body region, initial encounter: Secondary | ICD-10-CM

## 2014-08-10 NOTE — Progress Notes (Signed)
Patient ID: Kristin Bowman, female   DOB: 08-29-1970, 44 y.o.   MRN: [id number]  DOS: 06/17/14 s/p R plantar fasciotomy, heel spur resection, PRP injection  Subjective: [name] presents the office today for follow-up evaluation status post right foot surgery. She states that she is doing well the actual surgical site although she does have pain with there was a torn muscle. She's been going to physical therapy which she states that iontophoresis is helping. She did have a couple of bad days last week which she went to her primary care physician and she was prescribed tramadol for the pain. She continues to wear the night splint sometimes at night however she has discontinued it recently. She's been doing exercises at home for stretching although she does not ice the area. She denies any systemic complaints as fevers, chills, nausea, vomiting. Denies any calf pain, chest pain concerns of breath. No other complaints at this time in no acute changes since last appointment.  Objective: AAO x3, NAD; presents wearing a regular shoe DP/PT pulses palpable, CRT less than 3 seconds Protective sensation intact with Simms Weinstein monofilament  Incision on the medial aspect of the foot is well coapted without any evidence of dehiscence and a scab has formed. There is no surrounding erythema, ascending cellulitis, drainage, malodor. There is no clinical signs of infection. There is mild edema overlying the medial aspect of the heel along the surgical site although it does appear to be mostly decreased. There is decreased tenderness to palpation overlying the area of the surgery on the plantar medial tubercle although this pain has improved. There is continued pain to the lateral aspect of the foot along the fifth metatarsal base area. No other areas of tenderness to bilateral lower extremity. No other areas of edema, erythema, increase in warmth. No open lesions or pre-ulcerative lesions are identified. No pain  with calf compression, swelling, warmth, erythema.  Assessment: 44 year old female status post right foot surgery; with continued lateral foot pain  Plan: -X-rays were obtained and reviewed with the patient.  -Treatment options discuss  including all alternatives, risks, and complications -Can continue regular shoe gear. However wear night splint or cam boot at night every night. Continue ice to the area. Stretching exercises. Dispensed compression anklet. Ice daily.  -Rx compound cream.  -She previously had orthotics from Dr. Elijah Birk. Recommended her to bring them next appointment if she still has them. She would likely benefit from these.  -Follow-up in 4-6 weeks or sooner if any problems are to arise.  Continued monitoring clinical signs or symptoms of infection or DVT/PE admitted to call the office immediately should any occur or go to the ER.   Ovid Curd, DPM

## 2014-08-11 ENCOUNTER — Encounter: Payer: Self-pay | Admitting: *Deleted

## 2014-08-24 ENCOUNTER — Encounter: Payer: Self-pay | Admitting: Podiatry

## 2014-08-24 ENCOUNTER — Ambulatory Visit (INDEPENDENT_AMBULATORY_CARE_PROVIDER_SITE_OTHER): Payer: BC Managed Care – PPO | Admitting: Podiatry

## 2014-08-24 VITALS — BP 120/79 | HR 86 | Resp 18

## 2014-08-24 DIAGNOSIS — T148 Other injury of unspecified body region: Secondary | ICD-10-CM

## 2014-08-24 DIAGNOSIS — L309 Dermatitis, unspecified: Secondary | ICD-10-CM

## 2014-08-24 DIAGNOSIS — Z9889 Other specified postprocedural states: Secondary | ICD-10-CM

## 2014-08-24 DIAGNOSIS — T148XXA Other injury of unspecified body region, initial encounter: Secondary | ICD-10-CM

## 2014-08-25 NOTE — Progress Notes (Signed)
Patient ID: CHRISLYN SEEDORF, female   DOB: 12-Nov-1970, 44 y.o.   MRN: [id number]  DOS: 06/17/14 s/p R plantar fasciotomy, heel spur resection, PRP injection  Subjective: Ms. Corney presents the office today for follow-up evaluation status post right foot surgery. She states that overall she continues to have pain to her foot. She says the surgery site is doing well although she does have continued pain in the outside portion her foot overlying the site of the "torn muscle". Since last appointment she also states that she has developed a rash the top of her foot. She believes that she was wearing the pad from iontophoresis and she got hot and developed a rash. She states that she has been using daily, as all cream which seems to help. She states that it was more inflamed previously although it has improved. She daily had pain to her foot when she stands or walks for periods of time. She does continue stretching exercises and icing intermittently. She denies any systemic complaints as fevers, chills, nausea, vomiting. Denies any calf pain, chest pain concerns of breath. No other complaints at this time in no acute changes since last appointment.  Objective: AAO x3, NAD; presents wearing a regular shoe DP/PT pulses palpable, CRT less than 3 seconds Protective sensation intact with Simms Weinstein monofilament  Incision on the medial aspect of the foot is well coapted without any evidence of dehiscence and a scab has formed. There is no surrounding erythema, ascending cellulitis, drainage, malodor. There is no clinical signs of infection. There is decreased edema overlying the medial aspect of the heel along the surgical site. At this time there is no tenderness to palpation overlying the area of the surgery on the plantar medial tubercle. There is continued pain to the lateral aspect of the foot along the fifth metatarsal base area. No other areas of tenderness to bilateral lower extremity. No other areas  of edema, erythema, increase in warmth. No open lesions or pre-ulcerative lesions are identified. No pain with calf compression, swelling, warmth, erythema.  Assessment: 44 year old female status post right foot surgery; with continued lateral foot pain  Plan: -Treatment options discuss  including all alternatives, risks, and complications -Can continue regular shoe gear however recommend to wear a brace. Dispensed plantar fascial brace.  -Stretching and icing activities on a consistent basis. -Continue daily, cream for the rash on top of the foot although does not appear he fungal but she has responded treatment. -She previously had orthotics from Dr. Elijah Birk. Recommended her to bring them next appointment if she still has them. She would likely benefit from these.  -Follow-up in 4 weeks or sooner if any problems are to arise.  Continued monitoring clinical signs or symptoms of infection or DVT/PE admitted to call the office immediately should any occur or go to the ER.  -Possible steroid injection next appointmetn  Ovid Curd, DPM

## 2014-09-21 ENCOUNTER — Encounter: Payer: Self-pay | Admitting: Podiatry

## 2014-09-21 ENCOUNTER — Ambulatory Visit (INDEPENDENT_AMBULATORY_CARE_PROVIDER_SITE_OTHER): Payer: BC Managed Care – PPO | Admitting: Podiatry

## 2014-09-21 ENCOUNTER — Ambulatory Visit (INDEPENDENT_AMBULATORY_CARE_PROVIDER_SITE_OTHER): Payer: BC Managed Care – PPO

## 2014-09-21 VITALS — BP 114/77 | HR 72 | Resp 18

## 2014-09-21 DIAGNOSIS — S92301A Fracture of unspecified metatarsal bone(s), right foot, initial encounter for closed fracture: Secondary | ICD-10-CM

## 2014-09-21 DIAGNOSIS — R52 Pain, unspecified: Secondary | ICD-10-CM

## 2014-09-22 NOTE — Progress Notes (Signed)
Patient ID: Kristin Bowman, female   DOB: Oct 03, 1970, 44 y.o.   MRN: [id number]  DOS: 06/17/14 s/p R plantar fasciotomy, heel spur resection, PRP  Subjective: 44 yo female presents to the office today for follow up evaluation of right foot surgery. She states of the heel pain is doing well although she continues to have pain in the outside portion of her foot. She feels that she is getting another stress fracture to the outside of her foot. She's had multiple stress fractures in his foot previously. She denies any recent injury or trauma. Since last clinic she has returned back to work standing on her feet or. Also since last appointment she has had shingles and staph infection on her right leg. She is currently on treatment for this. She states that there is no significant improvement since starting medication. No other complaints at this time in no acute changes otherwise.  Objective: AAO 3, NAD Neurovascular status intact and unchanged Incision the medial aspect of the foot is well coapted without any evidence of dehiscence of the scars warmth. There is mild tenderness to palpation along the plantar aspect of the heel the plantar fascial surgical site. This pain appears to be decreased. There is tenderness to palpation around the fifth metatarsal and there is trace edema overlying the area without any associated erythema or increase in warmth. There is no other areas of pinpoint bony tenderness or pain the vibratory sensation bilateral lower extremity. There are multiple annular erythematous bumps on her right leg with crests overlying the area. Subjectively she thinks this area has improved significantly. There are currently no other open lesions or pre-ulcerative lesions. There is no pain with calf compression, swelling, warmth, erythema.  Assessment: 44 year old female with resolving right heel pain with continued lateral foot pain, likely fifth metatarsal fracture  Plan: -X-rays were obtained  and reviewed with the patient. There does appear to be a radiolucent line  near the base of the fifth metatarsal concerning for fracture. given that she has pain directly overlying this area was treated for fracture. Recommend her to continue with her CAM boot for she already has at home. Ice and elevation. -Continue night splint at night. -Pain medication as needed. -Continue treatment for staph infection for shingles as directed by her primary care physician. -Follow-up in 3 weeks or sooner if any problems arise. In the meantime, encouraged to call the office with any questions, concerns, change in symptoms.   Ovid Curd, DPM

## 2014-10-12 ENCOUNTER — Ambulatory Visit (INDEPENDENT_AMBULATORY_CARE_PROVIDER_SITE_OTHER): Payer: BC Managed Care – PPO | Admitting: Podiatry

## 2014-10-12 ENCOUNTER — Encounter: Payer: Self-pay | Admitting: Podiatry

## 2014-10-12 ENCOUNTER — Ambulatory Visit: Payer: Self-pay

## 2014-10-12 VITALS — BP 111/67 | HR 71 | Resp 14

## 2014-10-12 DIAGNOSIS — T148XXA Other injury of unspecified body region, initial encounter: Secondary | ICD-10-CM

## 2014-10-12 DIAGNOSIS — M79671 Pain in right foot: Secondary | ICD-10-CM

## 2014-10-12 DIAGNOSIS — Z9889 Other specified postprocedural states: Secondary | ICD-10-CM | POA: Diagnosis not present

## 2014-10-12 DIAGNOSIS — S92301D Fracture of unspecified metatarsal bone(s), right foot, subsequent encounter for fracture with routine healing: Secondary | ICD-10-CM

## 2014-10-12 DIAGNOSIS — T148 Other injury of unspecified body region: Secondary | ICD-10-CM

## 2014-10-12 MED ORDER — DOXYCYCLINE HYCLATE 100 MG PO TABS: 100.0000 mg | ORAL_TABLET | Freq: Two times a day (BID) | ORAL | Status: DC

## 2014-10-13 ENCOUNTER — Telehealth: Payer: Self-pay | Admitting: *Deleted

## 2014-10-13 DIAGNOSIS — M79671 Pain in right foot: Secondary | ICD-10-CM

## 2014-10-13 NOTE — Telephone Encounter (Addendum)
-----   Message from Vivi Barrack, DPM sent at 10/12/2014  4:49 PM EDT ----- Can you order an MRI of her right foot with contrast? She previous had a muscle tear and has continue pain. She recently underwent heel spur resection, plantar fascia surgery, and PRP.  Informed pt of orders and faxed orders to [facility] [phone].

## 2014-10-14 ENCOUNTER — Telehealth: Payer: Self-pay | Admitting: *Deleted

## 2014-10-14 MED ORDER — DOXYCYCLINE HYCLATE 100 MG PO TABS: 100.0000 mg | ORAL_TABLET | Freq: Two times a day (BID) | ORAL | Status: AC

## 2014-10-14 NOTE — Telephone Encounter (Signed)
Pt states she was to get antibiotic, but it was not called to her pharmacy.  I reviewed chart and Doxycycline had been sent to [facility] compounding pharmacy, I changed to pt's [facility] on [address] [place].

## 2014-10-15 ENCOUNTER — Telehealth: Payer: Self-pay | Admitting: *Deleted

## 2014-10-15 NOTE — Telephone Encounter (Signed)
Entered in error

## 2014-10-16 ENCOUNTER — Ambulatory Visit
Admission: RE | Admit: 2014-10-16 | Discharge: 2014-10-16 | Disposition: A | Discharge status: Home / Self Care | Payer: BC Managed Care – PPO | Source: Ambulatory Visit | Attending: Podiatry | Admitting: Podiatry

## 2014-10-16 DIAGNOSIS — M79671 Pain in right foot: Secondary | ICD-10-CM

## 2014-10-16 MED ORDER — GADOBENATE DIMEGLUMINE 529 MG/ML IV SOLN
16.0000 mL | Freq: Once | INTRAVENOUS | Status: AC | PRN
  Administered 2014-10-16: 16 mL via INTRAVENOUS

## 2014-10-17 NOTE — Progress Notes (Signed)
Patient ID: Kristin Bowman, female   DOB: June 14, 1970, 44 y.o.   MRN: [id number]  DOS: 06/17/14 s/p R plantar fasciotomy, heel spur resection, PRP  Subjective: 44 yo female presents to the office today for follow up evaluation of right foot surgery. She states that she continues to have some pain to her right foot has been ongoing in the same. The majority of her pain is the outside portion of her foot. She states the pain dura heel has improved since the surgery. Also along the incision she has had some dry red areas on the medial incision. She's also had other issues to her right leg over the last several months including shingles and a staph infection. She was given some cream to put on her legs. She currently denies any systemic complaints such as fevers, chills, nausea, vomiting. No other complaints at this time.  Objective: AAO 3, NAD Neurovascular status intact and unchanged Incision the medial aspect of the foot is well coapted without any evidence of dehiscence of the scars warmth. There is a small amount of dried, peeling, erythematous skin around the incision on the medial heel. There is mild tenderness to palpation on the incision however the pain to the heel does appear to be decreased. The majority the pain appears to be localized to the lateral portion of the foot along the fifth metatarsal base. Just has pain to the forefoot diffusely. There is no specific area of pinpoint bony tenderness of the limb the fifth metatarsal base. There is trace edema to the foot without any associated erythema except otherwise mentioned. There are no other open lesions or pre-ulcerative lesions. No pain with calf compression, swelling, warmth, erythema.  Assessment: 44 year old female with resolving right heel pain with continued lateral foot pain, likely fifth metatarsal fracture  Plan: -X-rays were obtained and reviewed with the patient.  The area of radial semi-on the base of the fifth metatarsal is  not as well visualized at today's appointment. However she discontinued her pain overlying this area. I do recommend he continue with surgical shoe or cam boot although she states that she cannot wear it and she does continue to wear regular shoe which she also presented in a today's appointment. She was wearing a flat dress-type shoe. -Due to the small amount of erythema around the incision will start doxycycline and case of infection. -Recommended to continue with night splint at night -Pain medication as needed. -Due to continued pain to her foot without much relief we'll repeat the MRI. Possibly the given her medical conditions this could also increase her time of healing. -Follow-up after or sooner if any problems arise. In the meantime, encouraged to call the office with any questions, concerns, change in symptoms.   Ovid CurdMatthew Malvin Morrish, DPM

## 2014-10-20 ENCOUNTER — Ambulatory Visit (INDEPENDENT_AMBULATORY_CARE_PROVIDER_SITE_OTHER): Payer: BC Managed Care – PPO | Admitting: Podiatry

## 2014-10-20 ENCOUNTER — Encounter: Payer: Self-pay | Admitting: Podiatry

## 2014-10-20 ENCOUNTER — Telehealth: Payer: Self-pay | Admitting: *Deleted

## 2014-10-20 VITALS — BP 121/82 | HR 89 | Resp 18

## 2014-10-20 DIAGNOSIS — M79671 Pain in right foot: Secondary | ICD-10-CM | POA: Diagnosis not present

## 2014-10-20 DIAGNOSIS — G8929 Other chronic pain: Secondary | ICD-10-CM

## 2014-10-20 DIAGNOSIS — M1990 Unspecified osteoarthritis, unspecified site: Secondary | ICD-10-CM | POA: Diagnosis not present

## 2014-10-20 DIAGNOSIS — T148XXA Other injury of unspecified body region, initial encounter: Secondary | ICD-10-CM

## 2014-10-20 DIAGNOSIS — T148 Other injury of unspecified body region: Secondary | ICD-10-CM | POA: Diagnosis not present

## 2014-10-20 NOTE — Telephone Encounter (Signed)
Dr. Ardelle AntonWagoner request [facility] Overread for MRI right foot w/without contrast.  [facility].

## 2014-10-21 LAB — CBC WITH DIFFERENTIAL/PLATELET
BASOS ABS: 0 10*3/uL (ref 0.0–0.1)
Basophils Relative: 0 % (ref 0–1)
Eosinophils Absolute: 0.1 10*3/uL (ref 0.0–0.7)
Eosinophils Relative: 2 % (ref 0–5)
HEMATOCRIT: 40 % (ref 36.0–46.0)
HEMOGLOBIN: 13.4 g/dL (ref 12.0–15.0)
LYMPHS ABS: 1.5 10*3/uL (ref 0.7–4.0)
LYMPHS PCT: 20 % (ref 12–46)
MCH: 30.9 pg (ref 26.0–34.0)
MCHC: 33.5 g/dL (ref 30.0–36.0)
MCV: 92.4 fL (ref 78.0–100.0)
MONOS PCT: 9 % (ref 3–12)
MPV: 9.3 fL (ref 8.6–12.4)
Monocytes Absolute: 0.7 10*3/uL (ref 0.1–1.0)
NEUTROS ABS: 5.1 10*3/uL (ref 1.7–7.7)
NEUTROS PCT: 69 % (ref 43–77)
PLATELETS: 206 10*3/uL (ref 150–400)
RBC: 4.33 MIL/uL (ref 3.87–5.11)
RDW: 12.8 % (ref 11.5–15.5)
WBC: 7.4 10*3/uL (ref 4.0–10.5)

## 2014-10-21 LAB — SEDIMENTATION RATE: SED RATE: 5 mm/h (ref 0–20)

## 2014-10-21 LAB — CYCLIC CITRUL PEPTIDE ANTIBODY, IGG: Cyclic Citrullin Peptide Ab: <16 Units

## 2014-10-21 LAB — RHEUMATOID FACTOR: Rhuematoid fact SerPl-aCnc: <10 IU/mL (ref <=14)

## 2014-10-21 LAB — BASIC METABOLIC PANEL
BUN: 14 mg/dL (ref 7–25)
CALCIUM: 9.5 mg/dL (ref 8.6–10.2)
CHLORIDE: 103 mmol/L (ref 98–110)
CO2: 28 mmol/L (ref 20–31)
CREATININE: 0.66 mg/dL (ref 0.50–1.10)
GLUCOSE: 81 mg/dL (ref 65–99)
Potassium: 4.1 mmol/L (ref 3.5–5.3)
Sodium: 137 mmol/L (ref 135–146)

## 2014-10-21 LAB — C-REACTIVE PROTEIN: CRP: <0.5 mg/dL (ref <0.60)

## 2014-10-21 LAB — ANA: ANA: NEGATIVE

## 2014-10-21 NOTE — Progress Notes (Addendum)
Patient ID: Kristin Bowman, female   DOB: 1970/08/19, 44 y.o.   MRN: [id number]  Subjective: 44 year old female presents the office they for follow-up evaluation of right foot pain discussed MRI results. She states that she continues have pain mostly to the outside portion of her foot. She states that she has pain with walking and standing. She denies any recent injury or trauma. She denies any swelling or redness overlying the area at this time. She did take the antibiotic. She is also concerned that she may have lupus that she has had rashes and other symptoms. She states that she has seen a dermatologist as well as her primary care physician they've never ordered  for which she is aware or sooner rheumatologist. No other complaints at this time in no acute changes.  Objective: AAO 3, NAD Neurovascular status intact and unchanged There is continued tenderness to the portion of the foot along the fifth metatarsal and diffusely on the lateral portion of the foot. There is no specific area pinpoint bony tenderness or pain the vibratory sensation today's appointment the pain appears to be diffuse. Subjective the pain to the heel has improved since the surgery. Incision is well-healed. There is some dry, peeling skin around the incision although the erythema appears to be significantly improved. There is no increase in warmth to the area. There is no fluctuation or crepitation. There is no drainage. No ascending cellulitis.  MMT 5/5, ROM WNL No open lesions or pre-ulcer lesions identified at this time. No pain with calf compression, swelling, warmth, erythema.  Assessment: 44 year old female with continuation of right foot pain mostly on the lateral portion with resolving heel pain since surgery.  Plan: -Treatment options discussed including all alternatives, risks, and complications -MRI results were discussed the patient. The MRI overall was negative except for some arthritis of the first MPJ  which she does not have pain at this time. Due to her continued symptoms I will request that over read on the MRI. Also given her ongoing foot pain I've ordered blood work to include CBC, basic metabolic panel, ESR, CRP, rheumatoid factor, anti-CCP, HLA-B27, ANA.  -Continue with the boot if needed. Continue with the stretching icing activities daily. Continue with where the night splint a cam boot at night. -Follow-up in 3 weeks or sooner if any problems are to arise. Hopefully that'll result of the over read and the results of the blood work. In the meantime I encouraged her to follow-up with her primary care physician. In the meantime call the office in the questions, concerns, change in symptoms as well. *I discussed the possible nerve conduction test that she does that she has some burning pain to the outside part of her feet. Her sensation overall intact and her vascular status is intact. If the other studies are negative we will consider nerve conduction test.  Celesta Gentile, DPM

## 2014-10-21 NOTE — Telephone Encounter (Signed)
FYI Lisa...

## 2014-10-22 ENCOUNTER — Telehealth: Payer: Self-pay

## 2014-10-22 NOTE — Telephone Encounter (Signed)
MRI disk sent to [facility] Over-read today

## 2014-10-23 LAB — HLA-B27 ANTIGEN: DNA RESULT: NOT DETECTED

## 2014-11-03 ENCOUNTER — Telehealth: Payer: Self-pay | Admitting: *Deleted

## 2014-11-03 NOTE — Telephone Encounter (Signed)
Dr. Ardelle AntonWagoner states the bloodwork of 10/20/2014 is within normal limits, still waiting for the MRI overread.  I informed pt of Dr. Gabriel RungWagoner's statement.

## 2014-11-03 NOTE — Telephone Encounter (Signed)
Entered in error

## 2014-11-10 ENCOUNTER — Encounter: Payer: Self-pay | Admitting: Podiatry

## 2014-11-10 ENCOUNTER — Ambulatory Visit (INDEPENDENT_AMBULATORY_CARE_PROVIDER_SITE_OTHER): Payer: BC Managed Care – PPO | Admitting: Podiatry

## 2014-11-10 VITALS — BP 114/79 | HR 86 | Resp 18

## 2014-11-10 DIAGNOSIS — M792 Neuralgia and neuritis, unspecified: Secondary | ICD-10-CM

## 2014-11-10 DIAGNOSIS — M79671 Pain in right foot: Secondary | ICD-10-CM

## 2014-11-11 MED ORDER — CLOTRIMAZOLE-BETAMETHASONE 1-0.05 % EX CREA: 1.0000 "application " | TOPICAL_CREAM | Freq: Two times a day (BID) | CUTANEOUS | Status: AC

## 2014-11-11 NOTE — Progress Notes (Signed)
Patient ID: Lenise Heraldamara L Nale, female   DOB: 11-Apr-1970, 44 y.o.   MRN: [id number]  Subjective: 44 year old female presents the office they for follow-up evaluation of right foot pain discussed MRI results which I sent for second opinion and to discuss lab work. She states that overall she is having some good days and some bad days. She states today is a good and she's not having much pain to her foot and she has been doing quite a bit of walking with padded shoes on. 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 She continues to get some numbness and tingling to the outside portion of her right foot into the fourth and fifth toes. She has [facility] seen [facility], she was told she had some nerve damage possibly coming from her back. She did have an injection in her back at one point but states it did not help and due to the pain she did not want another. She states that she previously did have nerve testing done but she is unsure what was done. She states she does not have any pain to her heel at the surgical site. She does get a rash over the incision and applies some OTC cream. No recent injury or trauma. No swelling or redness. No other complaints at this time.  Objective: AAO 3, NAD DP/PT pulses couple 2/4, CRT less than 3 seconds Protective sensation intact with Simms Weinstein monofilament, vibratory sensation intact, Achilles tendon reflex intact. Subjectively there is numbness in the fourth and fifth toes and the lateral aspect of the right foot. There is a healed incision along the medial aspect of the right foot with a scar. There does appear to be a dry, scaly, erythematous rash overlying the incision which subjectively doesn't itch. There is no drainage or purulence. No ascending cellulitis. No increase in warmth.  There is no tenderness the patient along the plantar medial tubercle of the calcaneus at insertion of plantar  fascia were to the medial band of plantar fascia. This pain seems to resolve. She discontinued some mild discomfort in the outside portion of her right foot and suggest medial the ball of her foot as well. No specific area pinpoint bony tenderness there is no pain vibratory sensation. There is no surrounding erythema, increase in warmth. MMT 5/5, ROM WNL No open lesions or pre-ulcerative lesions. No pain with calf compression, swelling, warmth, erythema.  Assessment: 44 year old female continued right foot pain, subjective numbness  Plan: -Treatment options discussed including all alternatives, risks, and complications -Reviewed MRI over read as well as the results of the patient. -Rx lotrisone cream for the rash over the medial heel -I will request records in [facility]. If no previous EMG, will order.  -Continue with supportive shoegear and orthotics. I modified the orthotics today.  -Follow-up in the near future or sooner if any problems arise. In the meantime, encouraged to call the office with any questions, concerns, change in symptoms.   Ovid CurdMatthew Wagoner, DPM

## 2014-12-04 ENCOUNTER — Telehealth: Payer: Self-pay | Admitting: *Deleted

## 2014-12-04 NOTE — Telephone Encounter (Addendum)
DR. Ardelle AntonWagoner request results of NCV/EMG.  Called [facility] and must fax records release form to their office [phone].  Pt to request records release form from [facility].

## 2014-12-08 ENCOUNTER — Ambulatory Visit: Payer: BC Managed Care – PPO | Admitting: Podiatry

## 2014-12-10 ENCOUNTER — Telehealth: Payer: Self-pay | Admitting: *Deleted

## 2014-12-10 NOTE — Telephone Encounter (Addendum)
Pt states she is at her doctor's office for a new rash and Dr. Colbert CoyerVinny is wanting to know the labs drawn.  I told pt and Dr. Colbert CoyerVinny while on speaker phone asked to have the results faxed to [phone].  Faxed results of labs ordered 10/20/2014.  When dermatologist saw it he had thought about biopsy but since the foot was so difficult to heal wanted to wait, the the other doctor said the rash on her leg was a staph and rash between her toes was impetigo. She states the cream that she was given is helping and would like to wait to see if improves.

## 2014-12-10 NOTE — Telephone Encounter (Signed)
Ok thanks. You can send the labs. Also, can you have her come in. If she keeps getting rashes then I need to biopsy it.

## 2014-12-10 NOTE — Telephone Encounter (Signed)
Ok thanks 

## 2016-10-19 IMAGING — MR MR FOOT*R* WO/W CM
6 of 10 series · 22 of 40 positions shown · IV contrast (multihance)
Comparison: Radiographs dated 10/12/2014

CLINICAL DATA: Chronic lateral right foot pain, progressive.

Creatinine was obtained on site at [HOSPITAL] at [HOSPITAL].
Results: Creatinine 0.8 mg/dL.
EXAM:
MRI OF THE RIGHT FOREFOOT WITHOUT AND WITH CONTRAST
TECHNIQUE: Multiplanar, multisequence MR imaging was performed both before and
after administration of intravenous contrast.
CONTRAST:  16mL MULTIHANCE GADOBENATE DIMEGLUMINE 529 MG/ML IV SOLN

[Series 5: T1 · coronal · 4.0mm · 0.44mm/px · 4 of 34 slices shown (1 of 2)]
[im 1/34]
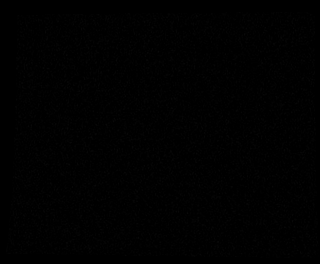
[im 12/34]
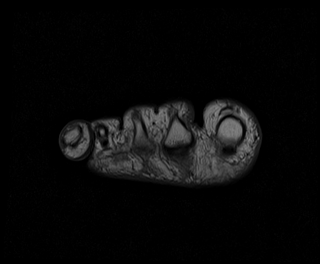
[im 23/34]
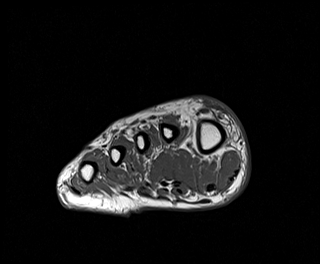
[im 34/34]
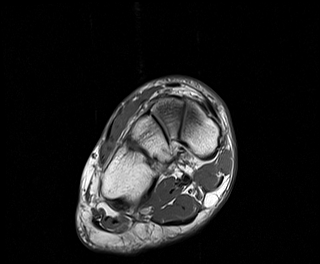

[Series 6: T2 fat-sat · coronal · 4.0mm · 0.27mm/px · 4 of 34 slices shown (1 of 3)]
[im 1/34]
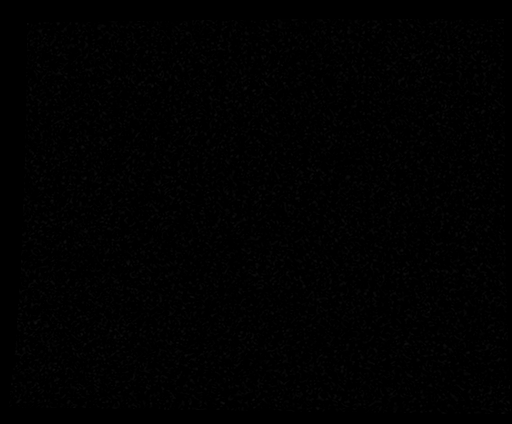
[im 12/34]
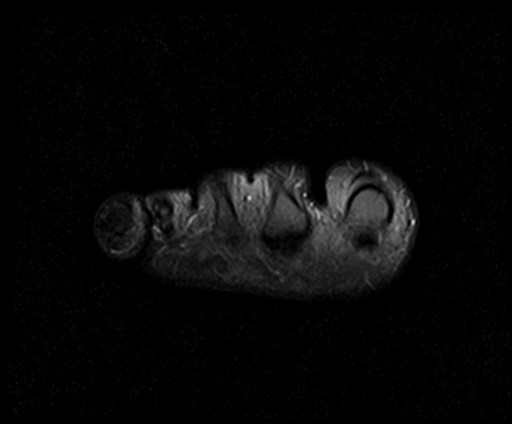
[im 23/34]
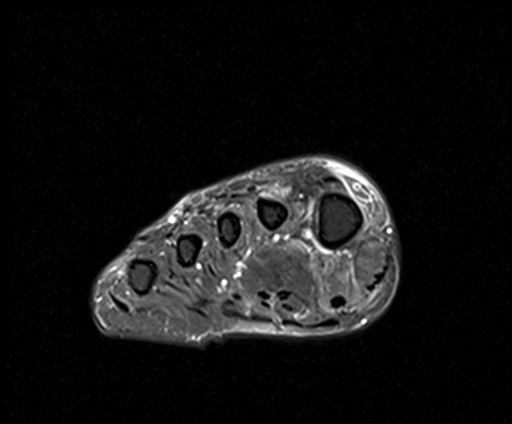
[im 34/34]
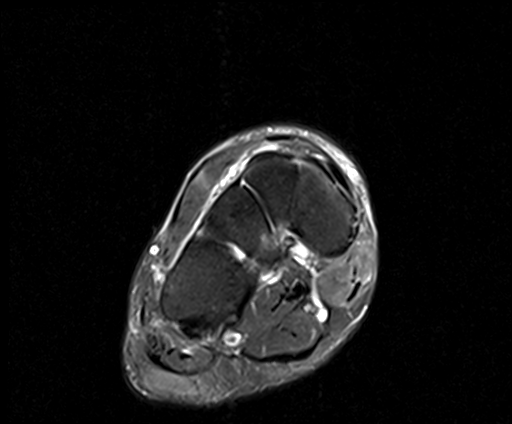

[Series 8: T2 fat-sat · axial · 3.0mm · 0.39mm/px · z∈[-148,-72]mm · 3 of 26 slices shown (2 of 3)]
[im 1/26]
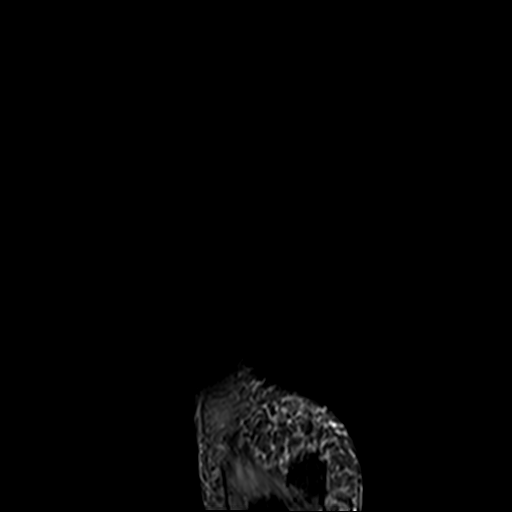
[im 13/26]
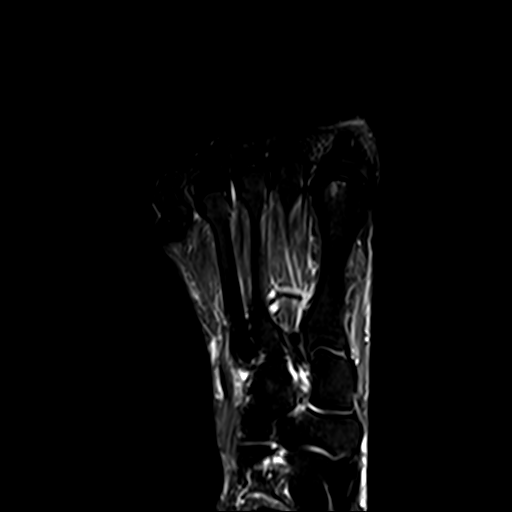
[im 26/26]
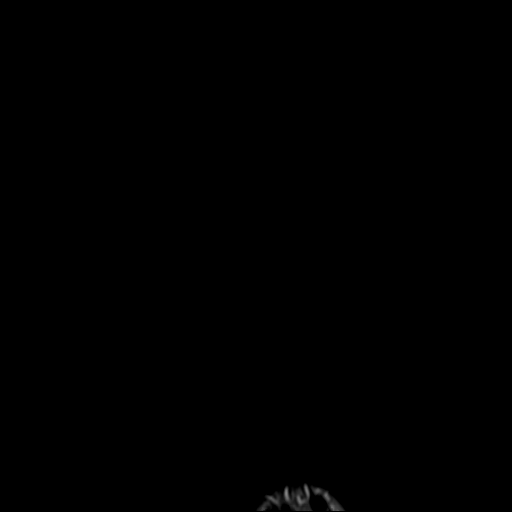

[Series 9: T1 · axial · 3.0mm · 0.31mm/px · z∈[-148,-72]mm · 3 of 26 slices shown (2 of 2)]
[im 1/26]
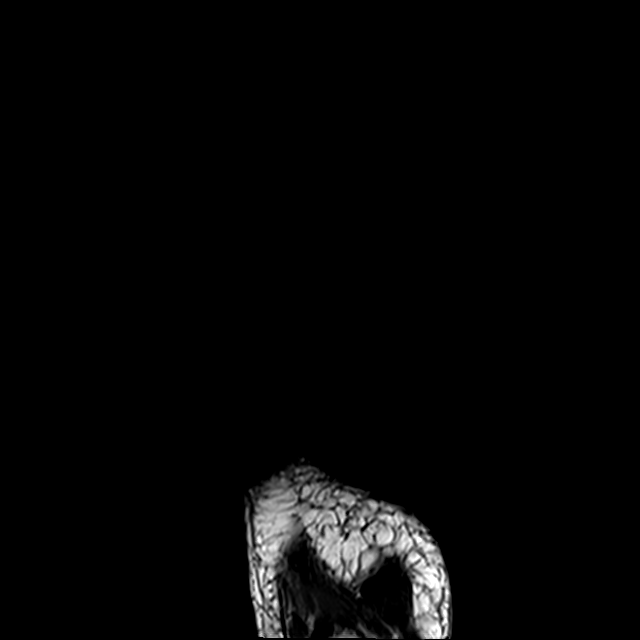
[im 13/26]
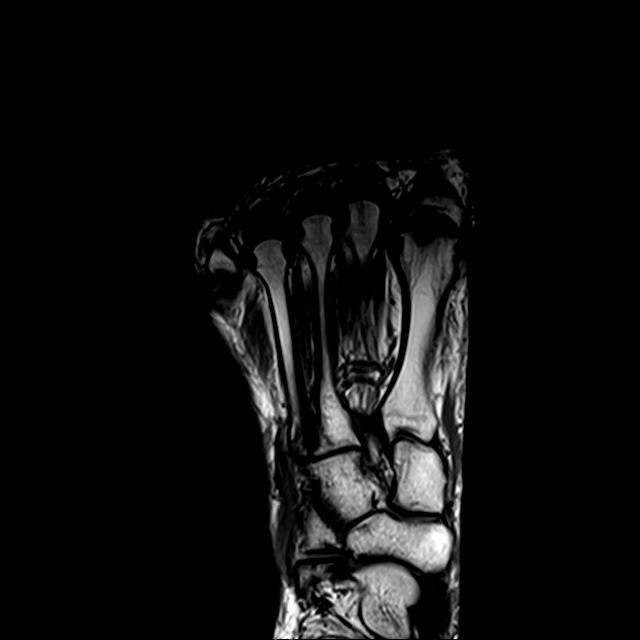
[im 26/26]
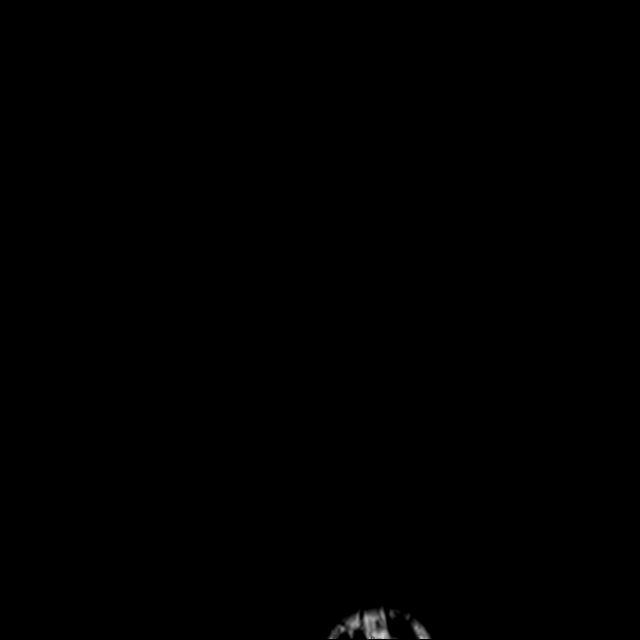

[Series 10: T2 fat-sat · sagittal · 3.5mm · 0.35mm/px · 4 of 26 slices shown (3 of 3)]
[im 1/26]
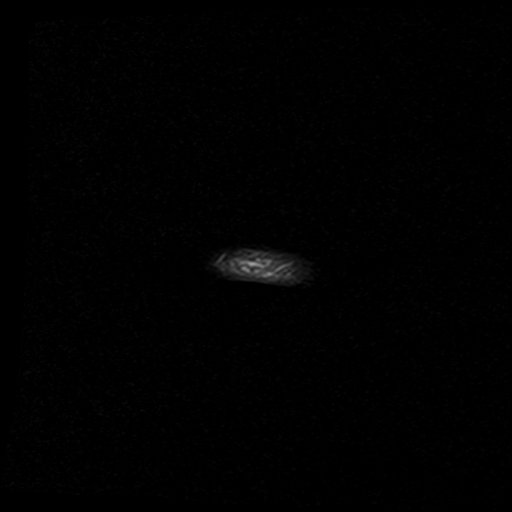
[im 9/26]
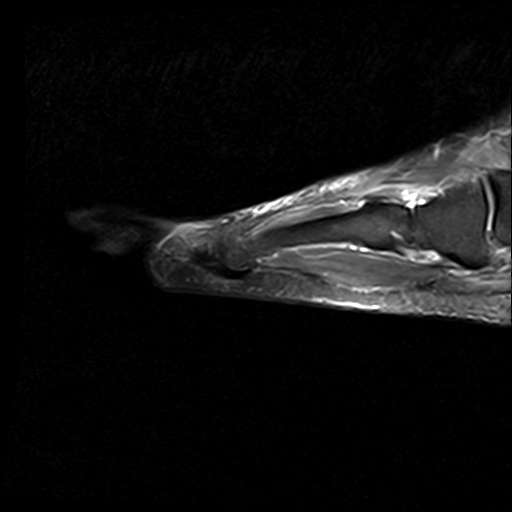
[im 17/26]
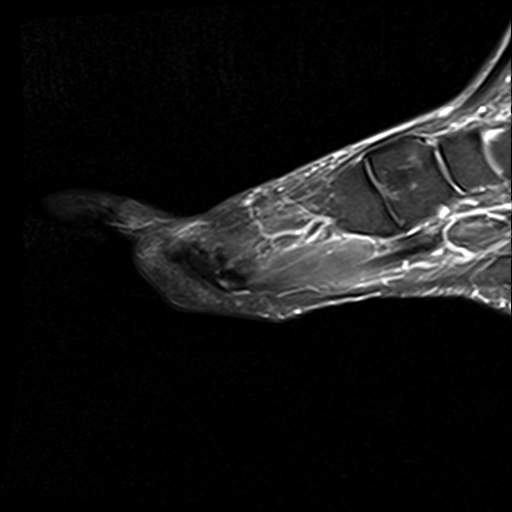
[im 26/26]
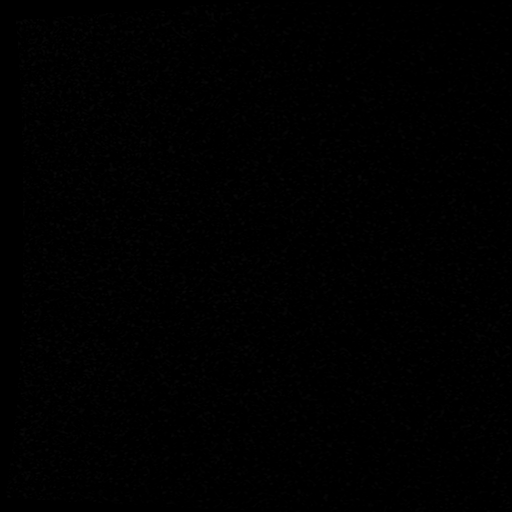

[Series 12: T1 fat-sat · coronal · non-contrast · 4.0mm · 0.41mm/px · 4 of 34 slices shown]
[im 1/34]
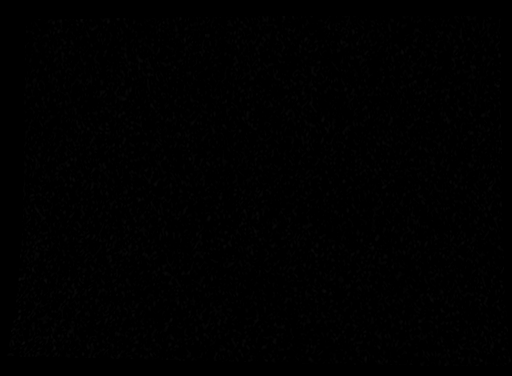
[im 9/34]
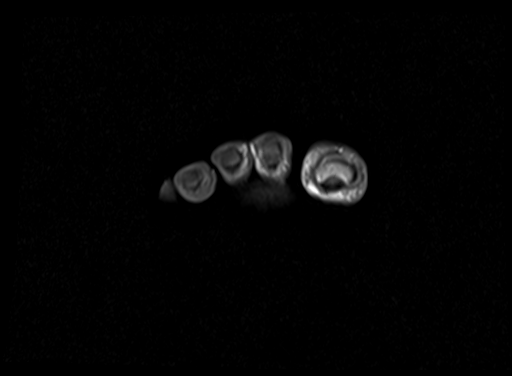
[im 17/34]
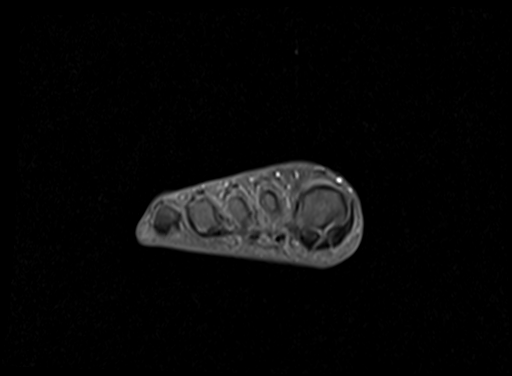
[im 25/34]
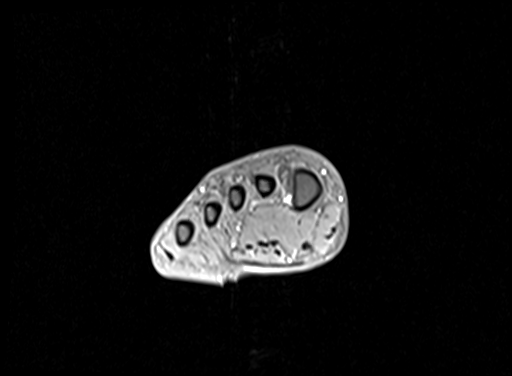

[22 of 40 positions shown; findings below may reference images not displayed]

FINDINGS: There is focal grade 4 chondromalacia of the head of the first
metatarsal. The osseous structures otherwise appear normal.

No evidence of stress reaction or stress fracture or other areas of
arthritis. There is no pathologic enhancement after contrast
administration.

There are no joint effusions. The muscles, tendons, and visualized
ligaments are normal.
IMPRESSION: Focal arthritis of the head of the first metatarsal. Otherwise,
normal exam. Specifically, no visible abnormalities at the lateral
aspect of the foot. No stress reactions or stress fractures.

## 2023-09-26 ENCOUNTER — Other Ambulatory Visit: Payer: Self-pay

## 2023-09-26 ENCOUNTER — Emergency Department (HOSPITAL_BASED_OUTPATIENT_CLINIC_OR_DEPARTMENT_OTHER)
Admission: EM | Admit: 2023-09-26 | Discharge: 2023-09-26 | Disposition: A | Discharge status: Home / Self Care | Attending: Emergency Medicine | Admitting: Emergency Medicine

## 2023-09-26 ENCOUNTER — Encounter (HOSPITAL_BASED_OUTPATIENT_CLINIC_OR_DEPARTMENT_OTHER): Payer: Self-pay

## 2023-09-26 DIAGNOSIS — T7840XA Allergy, unspecified, initial encounter: Secondary | ICD-10-CM | POA: Diagnosis present

## 2023-09-26 DIAGNOSIS — E119 Type 2 diabetes mellitus without complications: Secondary | ICD-10-CM | POA: Diagnosis not present

## 2023-09-26 DIAGNOSIS — J45909 Unspecified asthma, uncomplicated: Secondary | ICD-10-CM | POA: Insufficient documentation

## 2023-09-26 DIAGNOSIS — Z91040 Latex allergy status: Secondary | ICD-10-CM | POA: Diagnosis not present

## 2023-09-26 HISTORY — DX: Migraine, unspecified, not intractable, without status migrainosus: G43.909

## 2023-09-26 HISTORY — DX: Bell's palsy: G51.0

## 2023-09-26 MED ORDER — FAMOTIDINE IN NACL 20-0.9 MG/50ML-% IV SOLN
20.0000 mg | Freq: Once | INTRAVENOUS | Status: AC
  Administered 2023-09-26: 20 mg via INTRAVENOUS
  Filled 2023-09-26: qty 50

## 2023-09-26 MED ORDER — DIPHENHYDRAMINE HCL 50 MG/ML IJ SOLN
25.0000 mg | Freq: Once | INTRAMUSCULAR | Status: AC
  Administered 2023-09-26: 25 mg via INTRAVENOUS
  Filled 2023-09-26: qty 1

## 2023-09-26 MED ORDER — PREDNISONE 20 MG PO TABS: 40.0000 mg | ORAL_TABLET | Freq: Every day | ORAL | 0 refills | Status: AC

## 2023-09-26 MED ORDER — METHYLPREDNISOLONE SODIUM SUCC 125 MG IJ SOLR
125.0000 mg | Freq: Once | INTRAMUSCULAR | Status: AC
  Administered 2023-09-26: 125 mg via INTRAVENOUS
  Filled 2023-09-26: qty 2

## 2023-09-26 NOTE — Discharge Instructions (Addendum)
 As discussed, we have treated you for allergic reaction today.  Will send you home with steroids to take for the next several days.  Recommend continued use of your allergy medicine in the form of Zyrtec/Claritin.  Recommend additionally adding Pepcid  to this as well to help.  As we discussed, recommend trial of your choice of Lubriderm, Cetaphil, etc. anti-itch moisturizer.  Recommend follow-up with your specialist in the outpatient setting for reassessment.

## 2023-09-26 NOTE — ED Triage Notes (Signed)
 Facial rash and itching starting after eating chocolate ice cream around 1545 today.   Denies difficulty breathing or swelling.   Reports recent similar episodes w resolution after steroids.

## 2023-09-26 NOTE — ED Provider Notes (Signed)
 [place] EMERGENCY DEPARTMENT AT [facility] [place] Provider Note   CSN: [id number] Arrival date & time: 09/26/23  8361     Patient presents with: Allergic Reaction   Kristin Bowman is a 53 y.o. female.    Allergic Reaction   53 year old female presents emergency department with concern for allergic reaction.  States that she was eating [employer] ice cream with hot chocolate sauce on it.  States that not long after that, began to develop hives on her face describes it as itching in nature.  This occurred around 345.  Denies any chest pain, shortness of breath, abdominal pain, nausea, vomiting, difficulty breathing, feelings of throat closing on her.  Patient does report history of autoimmune issues with multiple known allergies.  States that she took 20 mg of prednisone  today as well as a Zyrtec this morning.  States that the symptoms are little bit better after steroids.  Presents emergency department for further assessment.  Past medical history significant for migraine, sarcoidosis, , GERD, celiac disease, asthma, diabetes mellitus type 2, hyperlipidemia  Prior to Admission medications   Medication Sig Start Date End Date Taking? Authorizing Provider  predniSONE  (DELTASONE ) 20 MG tablet Take 2 tablets (40 mg total) by mouth daily with breakfast for 5 days. 09/27/23 10/02/23 Yes Silver Fell A, PA  celecoxib (CELEBREX) 200 MG capsule  01/01/14   [provider]  cephALEXin (KEFLEX) 500 MG capsule Take 500 mg by mouth 3 (three) times daily.    Gershon Donnice SAUNDERS, DPM  clotrimazole -betamethasone  (LOTRISONE ) cream Apply 1 application topically 2 (two) times daily. 11/11/14   Gershon Donnice SAUNDERS, DPM  diphenhydrAMINE  (BENADRYL ) 25 MG tablet Take 2 tablets (50 mg total) by mouth every 6 (six) hours. Take 1-2 tablets every 6 hours x 2 days, then space out to an as needed basis 08/17/13   Mabe, [place] LITTIE, MD  doxycycline  (VIBRA -TABS) 100 MG tablet Take 1 tablet (100 mg total)  by mouth 2 (two) times daily. 10/14/14   Gershon Donnice SAUNDERS, DPM  ketoconazole (NIZORAL) 2 % cream apply A THIN LAYER to affected area (S) twice a day 05/28/14   [provider]  lidocaine (XYLOCAINE) 5 % ointment APPLY 1 GRAM [2 PUMPS] TO THE AFFECTED AREA 3 TIMES A DAY 08/11/14   [provider]  LYRICA 100 MG capsule  02/03/14   [provider]  methylPREDNISolone  (MEDROL ) 4 MG tablet Take 8 mg by mouth daily.    [provider]  NONFORMULARY OR COMPOUNDED ITEM (BCDL) Musculoskeletal pain: Baclofen 2%, Cyclobenzaprine 2%, Diclofenac 3%, Lidocaine 2% dispense 180gram +2 refills from [facility]    [provider]  oxyCODONE -acetaminophen  (PERCOCET/ROXICET) 5-325 MG per tablet Take 1-2 tablets by mouth every 4 (four) hours as needed for severe pain. 07/13/14   Gershon Donnice SAUNDERS, DPM  promethazine (PHENERGAN) 12.5 MG tablet Take 12.5 mg by mouth every 4 (four) hours as needed for nausea or vomiting.    Gershon Donnice SAUNDERS, DPM  tiZANidine (ZANAFLEX) 4 MG tablet  07/12/14   [provider]  traMADol DANNY) 50 MG tablet  07/31/14   [provider]  valACYclovir (VALTREX) 1000 MG tablet  01/22/14   [provider]    Allergies: Latex, Morphine and codeine, and Wheat    Review of Systems  All other systems reviewed and are negative.   Updated Vital Signs BP (!) 168/100   Pulse 94   Temp (!) 97.5 F (36.4 C) (Oral)   Resp 18   SpO2  98%   Physical Exam Vitals and nursing note reviewed.  Constitutional:      General: She is not in acute distress.    Appearance: She is well-developed.  HENT:     Head: Normocephalic and atraumatic.  Eyes:     Conjunctiva/sclera: Conjunctivae normal.  Cardiovascular:     Rate and Rhythm: Normal rate and regular rhythm.     Heart sounds: No murmur heard. Pulmonary:     Effort: Pulmonary effort is normal. No respiratory distress.     Breath sounds: Normal breath sounds. No wheezing, rhonchi or  rales.  Abdominal:     Palpations: Abdomen is soft.     Tenderness: There is no abdominal tenderness. There is no guarding.  Musculoskeletal:        General: No swelling.     Cervical back: Neck supple.  Skin:    General: Skin is warm and dry.     Capillary Refill: Capillary refill takes less than 2 seconds.     Findings: Rash present.     Comments: Urticarial rash on face.  No oropharyngeal involvement.  Neurological:     Mental Status: She is alert.  Psychiatric:        Mood and Affect: Mood normal.     (all labs ordered are listed, but only abnormal results are displayed) Labs Reviewed - No data to display  EKG: None  Radiology: No results found.   Procedures   Medications Ordered in the ED  methylPREDNISolone  sodium succinate (SOLU-MEDROL ) 125 mg/2 mL injection 125 mg (125 mg Intravenous Given 09/26/23 1720)  diphenhydrAMINE  (BENADRYL ) injection 25 mg (25 mg Intravenous Given 09/26/23 1718)  famotidine  (PEPCID ) IVPB 20 mg premix (0 mg Intravenous Stopped 09/26/23 1750)                                    Medical Decision Making Risk Prescription drug management.   This patient presents to the ED for concern of allergic reaction, this involves an extensive number of treatment options, and is a complaint that carries with it a high risk of complications and morbidity.  The differential diagnosis includes allergic reaction, angioedema, anaphylaxis, other   Co morbidities that complicate the patient evaluation  See HPI   Additional history obtained:  Additional history obtained from EMR External records from outside source obtained and reviewed including hospital records   Lab Tests:  N/a   Imaging Studies ordered:  N/a   Cardiac Monitoring: / EKG:  N/a   Consultations Obtained:  N/a   Problem List / ED Course / Critical interventions / Medication management  Allergic reaction I ordered medication including Solu-Medrol , Benadryl , Pepcid     Reevaluation of the patient after these medicines showed that the patient improved I have reviewed the patients home medicines and have made adjustments as needed   Social Determinants of Health:  Former cigarette use.  Denies illicit drug use.   Test / Admission - Considered:  Allergic reaction Vitals signs significant for hypertension. Otherwise within normal range and stable throughout visit.  53 year old female presents emergency department with concern for allergic reaction.  States that she was eating [employer] ice cream with hot chocolate sauce on it.  States that not long after that, began to develop hives on her face describes it as itching in nature.  This occurred around 345.  Denies any chest pain, shortness of breath, abdominal pain, nausea, vomiting, difficulty breathing, feelings  of throat closing on her.  Patient does report history of autoimmune issues with multiple known allergies.  States that she took 20 mg of prednisone  today as well as a Zyrtec this morning.  States that the symptoms are little bit better after steroids.  Presents emergency department for further assessment. On exam, mild urticarial rash appreciated on face with no other rash elsewhere.  Abdomen nontender.  Lungs clear to oscillation bilaterally.  No auscultatory stridor.  Treated with corticosteroid, antihistamines and noted significant improvement/resolution of symptoms.  Observed for which enough hours in the emergency department without additional episodes.  Offered continued monitoring versus monitoring at home which she elected for the latter.  Will recommend symptomatic therapy as an AVS and follow-up with primary care for reassessment.  Treatment plan discussed with patient and she acknowledged understanding was agreeable.  Patient well-appearing, afebrile in no acute distress upon discharge. Worrisome signs and symptoms were discussed with the patient, and the patient acknowledged understanding to  return to the ED if noticed. Patient was stable upon discharge.       Final diagnoses:  None    ED Discharge Orders          Ordered    predniSONE  (DELTASONE ) 20 MG tablet  Daily with breakfast        09/26/23 1841               Silver Wonda LABOR, [place] 09/26/23 1905    Lenor Hollering, MD 09/26/23 [phone]
